# Patient Record
Sex: Male | Born: 1940 | Race: White | Hispanic: No | Marital: Married | Smoking: Never smoker
Health system: Southern US, Community
[De-identification: ages and names within clinical notes are randomized; demographics above are authoritative.]

## PROBLEM LIST (undated history)

## (undated) DIAGNOSIS — E039 Hypothyroidism, unspecified: Secondary | ICD-10-CM

## (undated) DIAGNOSIS — F341 Dysthymic disorder: Secondary | ICD-10-CM

## (undated) DIAGNOSIS — I509 Heart failure, unspecified: Secondary | ICD-10-CM

## (undated) DIAGNOSIS — E119 Type 2 diabetes mellitus without complications: Secondary | ICD-10-CM

## (undated) DIAGNOSIS — E785 Hyperlipidemia, unspecified: Secondary | ICD-10-CM

## (undated) DIAGNOSIS — J449 Chronic obstructive pulmonary disease, unspecified: Secondary | ICD-10-CM

## (undated) DIAGNOSIS — I2510 Atherosclerotic heart disease of native coronary artery without angina pectoris: Secondary | ICD-10-CM

## (undated) HISTORY — DX: Chronic obstructive pulmonary disease, unspecified: J44.9

## (undated) HISTORY — DX: Atherosclerotic heart disease of native coronary artery without angina pectoris: I25.10

## (undated) HISTORY — DX: Dysthymic disorder: F34.1

## (undated) HISTORY — DX: Type 2 diabetes mellitus without complications: E11.9

## (undated) HISTORY — PX: MEDIASTINOSCOPY: SUR861

## (undated) HISTORY — DX: Hyperlipidemia, unspecified: E78.5

## (undated) HISTORY — DX: Hypothyroidism, unspecified: E03.9

## (undated) HISTORY — DX: Heart failure, unspecified: I50.9

## (undated) HISTORY — PX: CORONARY ARTERY BYPASS GRAFT: SHX141

## (undated) HISTORY — PX: CHOLECYSTECTOMY: SHX55

---

## 2009-01-21 ENCOUNTER — Ambulatory Visit: Payer: Self-pay | Admitting: Cardiology

## 2013-07-05 DIAGNOSIS — K219 Gastro-esophageal reflux disease without esophagitis: Secondary | ICD-10-CM | POA: Insufficient documentation

## 2013-07-05 DIAGNOSIS — E559 Vitamin D deficiency, unspecified: Secondary | ICD-10-CM | POA: Insufficient documentation

## 2013-07-05 DIAGNOSIS — I10 Essential (primary) hypertension: Secondary | ICD-10-CM | POA: Insufficient documentation

## 2013-07-05 DIAGNOSIS — I2510 Atherosclerotic heart disease of native coronary artery without angina pectoris: Secondary | ICD-10-CM | POA: Insufficient documentation

## 2013-07-05 DIAGNOSIS — R55 Syncope and collapse: Secondary | ICD-10-CM | POA: Insufficient documentation

## 2013-07-05 DIAGNOSIS — F411 Generalized anxiety disorder: Secondary | ICD-10-CM | POA: Insufficient documentation

## 2013-07-05 DIAGNOSIS — J449 Chronic obstructive pulmonary disease, unspecified: Secondary | ICD-10-CM | POA: Insufficient documentation

## 2014-03-29 DIAGNOSIS — R918 Other nonspecific abnormal finding of lung field: Secondary | ICD-10-CM | POA: Insufficient documentation

## 2016-04-15 LAB — LIPID PANEL
CHOLESTEROL: 175 mg/dL (ref 0–200)
HDL: 45 mg/dL (ref 35–70)
LDL Cholesterol: 107 mg/dL
TRIGLYCERIDES: 137 mg/dL (ref 40–160)

## 2016-04-15 LAB — HEMOGLOBIN A1C: Hemoglobin A1C: 10

## 2016-04-15 LAB — TSH: TSH: 0.72 u[IU]/mL (ref 0.41–5.90)

## 2016-05-09 ENCOUNTER — Encounter: Payer: Self-pay | Admitting: "Endocrinology

## 2016-05-09 ENCOUNTER — Ambulatory Visit (INDEPENDENT_AMBULATORY_CARE_PROVIDER_SITE_OTHER): Payer: Medicare Other | Admitting: "Endocrinology

## 2016-05-09 VITALS — BP 154/68 | HR 71 | Ht 72.0 in | Wt 216.0 lb

## 2016-05-09 DIAGNOSIS — E039 Hypothyroidism, unspecified: Secondary | ICD-10-CM | POA: Diagnosis not present

## 2016-05-09 DIAGNOSIS — I639 Cerebral infarction, unspecified: Secondary | ICD-10-CM | POA: Diagnosis not present

## 2016-05-09 DIAGNOSIS — I10 Essential (primary) hypertension: Secondary | ICD-10-CM | POA: Diagnosis not present

## 2016-05-09 DIAGNOSIS — E7800 Pure hypercholesterolemia, unspecified: Secondary | ICD-10-CM | POA: Diagnosis not present

## 2016-05-09 DIAGNOSIS — C3490 Malignant neoplasm of unspecified part of unspecified bronchus or lung: Secondary | ICD-10-CM | POA: Diagnosis not present

## 2016-05-09 DIAGNOSIS — E1159 Type 2 diabetes mellitus with other circulatory complications: Secondary | ICD-10-CM | POA: Diagnosis not present

## 2016-05-09 NOTE — Progress Notes (Signed)
Subjective:    Patient ID: Barry Turner, male    DOB: 05-02-1940. Patient is being seen in consultation for management of diabetes requested by  Quentin Cornwall, MD  Past Medical History:  Diagnosis Date  . CHF (congestive heart failure) ([place])   . COPD (chronic obstructive pulmonary disease) ([place])   . Coronary artery disease   . Diabetes mellitus, type II ([place])   . Dysthymic disorder   . Hyperlipidemia   . Hypothyroidism    Past Surgical History:  Procedure Laterality Date  . CHOLECYSTECTOMY    . CORONARY ARTERY BYPASS GRAFT    . MEDIASTINOSCOPY     Social History   Social History  . Marital status: Married    Spouse name: N/A  . Number of children: N/A  . Years of education: N/A   Social History Main Topics  . Smoking status: Never Smoker  . Smokeless tobacco: Never Used  . Alcohol use No  . Drug use: No  . Sexual activity: Not Asked   Other Topics Concern  . None   Social History Narrative  . None   Outpatient Encounter Prescriptions as of 05/09/2016  Medication Sig  . amoxicillin (AMOXIL) 500 MG capsule Take 500 mg by mouth as directed.  Marland Kitchen apixaban (ELIQUIS) 5 MG TABS tablet Take 5 mg by mouth 2 (two) times daily.  Marland Kitchen aspirin EC 81 MG tablet Take 81 mg by mouth daily.  . carvedilol (COREG) 12.5 MG tablet Take 12.5 mg by mouth 2 (two) times daily with a meal.  . clarithromycin (BIAXIN) 500 MG tablet Take 500 mg by mouth as directed.  Marland Kitchen esomeprazole (NEXIUM) 40 MG capsule Take 40 mg by mouth daily at 12 noon.  . Fluticasone-Salmeterol (ADVAIR) 250-50 MCG/DOSE AEPB Inhale 1 puff into the lungs 2 (two) times daily as needed.  . furosemide (LASIX) 20 MG tablet Take 20 mg by mouth 2 (two) times daily.  Marland Kitchen ibuprofen (ADVIL,MOTRIN) 800 MG tablet Take 800 mg by mouth every 8 (eight) hours as needed.  . insulin aspart (NOVOLOG FLEXPEN) 100 UNIT/ML FlexPen Inject 10-16 Units into the skin 3 (three) times daily with meals.  . Insulin Glargine (TOUJEO SOLOSTAR) 300  UNIT/ML SOPN Inject 40 Units into the skin at bedtime.  Marland Kitchen ipratropium-albuterol (DUONEB) 0.5-2.5 (3) MG/3ML SOLN Take 3 mLs by nebulization as directed.  . isosorbide dinitrate (ISORDIL) 30 MG tablet Take 30 mg by mouth daily.  Marland Kitchen levothyroxine (SYNTHROID, LEVOTHROID) 50 MCG tablet Take 50 mcg by mouth daily before breakfast.  . LORazepam (ATIVAN) 0.5 MG tablet Take 0.5 mg by mouth at bedtime.  Marland Kitchen losartan (COZAAR) 100 MG tablet Take 100 mg by mouth daily.  . meclizine (ANTIVERT) 25 MG tablet Take 25 mg by mouth 2 (two) times daily as needed for dizziness.  . nitroGLYCERIN (NITROSTAT) 0.4 MG SL tablet Place 0.4 mg under the tongue every 5 (five) minutes as needed for chest pain.  . potassium chloride (K-DUR) 10 MEQ tablet Take 10 mEq by mouth daily.  . predniSONE (DELTASONE) 10 MG tablet Take 10 mg by mouth daily with breakfast.  . ranitidine (ZANTAC) 150 MG capsule Take 150 mg by mouth 2 (two) times daily.  . TERBINAFINE HCL PO Take by mouth.   No facility-administered encounter medications on file as of 05/09/2016.    ALLERGIES: Allergies  Allergen Reactions  . Plavix [Clopidogrel]   . Sucralfate    VACCINATION STATUS:  There is no immunization history on file for this patient.  Diabetes  He presents for his initial diabetic visit. He has type 2 diabetes mellitus. Onset time: He was diagnosed at approximate age of 76 years. His disease course has been worsening. There are no hypoglycemic associated symptoms. Pertinent negatives for hypoglycemia include no confusion, headaches, pallor or seizures. There are no diabetic associated symptoms. Pertinent negatives for diabetes include no chest pain, no fatigue, no polydipsia, no polyphagia, no polyuria and no weakness. There are no hypoglycemic complications. Symptoms are worsening. Diabetic complications include a CVA and peripheral neuropathy. Risk factors for coronary artery disease include diabetes mellitus, hypertension, obesity and sedentary  lifestyle. Current diabetic treatments: He is currently on basal /bolus insulin he reportedlyToujeo 42 units daily at bedtime and NovoLog 20 units 3 times a day before meals. His weight is decreasing steadily. He is following a generally unhealthy diet. When asked about meal planning, he reported none. He has not had a previous visit with a dietitian. He participates in exercise intermittently. His breakfast blood glucose range is generally >200 mg/dl. His lunch blood glucose range is generally 140-180 mg/dl. His dinner blood glucose range is generally 140-180 mg/dl. His overall blood glucose range is >200 mg/dl. An ACE inhibitor/angiotensin II receptor blocker is being taken.  Hyperlipidemia  This is a chronic problem. The current episode started more than 1 year ago. The problem is uncontrolled. Exacerbating diseases include diabetes and hypothyroidism. Pertinent negatives include no chest pain, myalgias or shortness of breath. He is currently on no antihyperlipidemic treatment. Risk factors for coronary artery disease include diabetes mellitus, dyslipidemia, hypertension, a sedentary lifestyle and male sex.  Hypertension  This is a chronic problem. The current episode started more than 1 year ago. Pertinent negatives include no chest pain, headaches, neck pain, palpitations or shortness of breath. Risk factors for coronary artery disease include diabetes mellitus, dyslipidemia and male gender. Past treatments include angiotensin blockers. Hypertensive end-organ damage includes CVA.       Review of Systems  Constitutional: Negative for chills, fatigue, fever and unexpected weight change.  HENT: Negative for dental problem, mouth sores and trouble swallowing.   Eyes: Negative for visual disturbance.  Respiratory: Negative for cough, choking, chest tightness, shortness of breath and wheezing.   Cardiovascular: Negative for chest pain, palpitations and leg swelling.  Gastrointestinal: Negative for  abdominal distention, abdominal pain, constipation, diarrhea, nausea and vomiting.  Endocrine: Negative for polydipsia, polyphagia and polyuria.  Genitourinary: Negative for dysuria, flank pain, hematuria and urgency.  Musculoskeletal: Negative for back pain, gait problem, myalgias and neck pain.  Skin: Negative for pallor, rash and wound.  Neurological: Negative for seizures, syncope, weakness, numbness and headaches.  Psychiatric/Behavioral: Negative.  Negative for confusion and dysphoric mood.    Objective:    BP (!) 154/68   Pulse 71   Ht 6' (1.829 m)   Wt 216 lb (98 kg)   BMI 29.29 kg/m   Wt Readings from Last 3 Encounters:  05/09/16 216 lb (98 kg)    Physical Exam  Constitutional: He is oriented to person, place, and time. He appears well-developed and well-nourished. He is cooperative. No distress.  HENT:  Head: Normocephalic and atraumatic.  Eyes: EOM are normal.  Neck: Normal range of motion. Neck supple. No tracheal deviation present. No thyromegaly present.  Cardiovascular: Normal rate, S1 normal, S2 normal and normal heart sounds.  Exam reveals no gallop.   No murmur heard. Pulses:      Dorsalis pedis pulses are 1+ on the right side, and 1+ on the  left side.       Posterior tibial pulses are 1+ on the right side, and 1+ on the left side.  Pulmonary/Chest: No respiratory distress. He has wheezes. He has rales.  Abdominal: Soft. Bowel sounds are normal. He exhibits no distension. There is no tenderness. There is no guarding and no CVA tenderness.  Musculoskeletal: He exhibits no edema.       Right shoulder: He exhibits no swelling and no deformity.  Neurological: He is alert and oriented to person, place, and time. He has normal strength and normal reflexes. No cranial nerve deficit or sensory deficit. Gait normal.  Skin: Skin is warm and dry. No rash noted. No cyanosis. Nails show no clubbing.  Psychiatric: He has a normal mood and affect. His speech is normal and  behavior is normal. Judgment and thought content normal. Cognition and memory are normal.    Diabetic Labs (most recent): Lab Results  Component Value Date   HGBA1C 10 04/15/2016     Lipid Panel ( most recent) Lipid Panel     Component Value Date/Time   CHOL 175 04/15/2016   TRIG 137 04/15/2016   HDL 45 04/15/2016   LDLCALC 107 04/15/2016    Assessment & Plan:   1. DM type 2 causing vascular disease ([place])  - Patient has currently uncontrolled symptomatic type 2 DM since 76 years of age,  with most recent A1c of 10 %. Recent labs reviewed. - His recent and logs shown significant fluctuation in his blood glucose profile, including postprandial hypoglycemia into 60s randomly.  his diabetes is complicated by CVA and patient remains at a high risk for more acute and chronic complications of diabetes which include CAD, CVA, CKD, retinopathy, and neuropathy. These are all discussed in detail with the patient.  - I have counseled the patient on diet management and weight loss, by adopting a carbohydrate restricted/protein rich diet.  - Suggestion is made for patient to avoid simple carbohydrates   from their diet including Cakes , Desserts, Ice Cream,  Soda (  diet and regular) , Sweet Tea , Candies,  Chips, Cookies, Artificial Sweeteners,   and "Sugar-free" Products . This will help patient to have stable blood glucose profile and potentially avoid unintended weight gain.  - I encouraged the patient to switch to  unprocessed or minimally processed complex starch and increased protein intake (animal or plant source), fruits, and vegetables.  - Patient is advised to stick to a routine mealtimes to eat 3 meals  a day and avoid unnecessary snacks ( to snack only to correct hypoglycemia).  - The patient will be scheduled with Jearld Fenton, RDN, CDE for individualized DM education.  - I have approached patient with the following individualized plan to manage diabetes and patient agrees:    -  He is taking prednisone 10 mg by mouth every a.m. for chronic COPD. I advised him to avoid sudden stopping of prednisone to prevent adrenal crisis.  - He will need significant adjustment in his basal/bolus insulin. The main goal of therapy in this patient would be avoiding hypoglycemia due to advanced age. -  I  will proceed to readjust his basal insulin Toujeo to 40 units daily at bedtime, lower his prandial insulin NovoLog to 10 units 3 times a day before meals for premeal BG readings of 90-'150mg'$ /dl, plus patient specific correction dose for unexpected hyperglycemia above '150mg'$ /dl, associated with strict monitoring of glucose  AC and HS. - Patient is warned not to take insulin  without proper monitoring per orders. -Adjustment parameters are given for hypo and hyperglycemia in writing. -Patient is encouraged to call clinic for blood glucose levels less than 70 or above 300 mg /dl. - Reportedly, he does not tolerate metformin, tried DPPIV  inhibitors before, and like to avoid those medications for now. - His renal function is normal. - Patient will be considered for incretin therapy as appropriate next visit. - Patient specific target  A1c;  LDL, HDL, Triglycerides, and  Waist Circumference were discussed in detail.  2) BP/HTN: Uncontrolled. Continue current medications including ARB. 3) Lipids/HPL:   Uncontrolled with LDL 107, he is not on statin. He will be considered for low-dose statin on subsequent visit.  4)  Weight/Diet: CDE Consult will be initiated , exercise, and detailed carbohydrates information provided.  5) hypothyroidism: -TSH from 04/15/2016 was 0.72. He is on levothyroxine 50 g by mouth every morning.  - We discussed about correct intake of levothyroxine, at fasting, with water, separated by at least 30 minutes from breakfast, and separated by more than 4 hours from calcium, iron, multivitamins, acid reflux medications (PPIs). -Patient is made aware of the fact that  thyroid hormone replacement is needed for life, dose to be adjusted by periodic monitoring of thyroid function tests.  6) Chronic Care/Health Maintenance:  -Patient is on ARB and encouraged to continue to follow up with Ophthalmology, Podiatrist at least yearly or according to recommendations, and advised to   stay away from smoking. I have recommended yearly flu vaccine and pneumonia vaccination at least every 5 years; moderate intensity exercise for up to 150 minutes weekly; and  sleep for at least 7 hours a day.  - 60 minutes of time was spent on the care of this patient , 50% of which was applied for counseling on diabetes complications and their preventions.  - Patient to bring meter and  blood glucose logs during his next visit.   - I advised patient to maintain close follow up with [facility], MD for primary care needs.  Follow up plan: - Return in about 1 week (around 05/16/2016) for follow up with meter and logs- no labs.  Glade Lloyd, MD Phone: [phone]  Fax: [phone]   05/09/2016, 4:12 PM

## 2016-05-09 NOTE — Patient Instructions (Signed)

## 2016-05-13 ENCOUNTER — Other Ambulatory Visit: Payer: Self-pay

## 2016-05-13 MED ORDER — INSULIN GLARGINE 300 UNIT/ML ~~LOC~~ SOPN: 40.0000 [IU] | PEN_INJECTOR | Freq: Every day | SUBCUTANEOUS | 2 refills | Status: DC

## 2016-05-21 ENCOUNTER — Encounter: Payer: Self-pay | Admitting: "Endocrinology

## 2016-05-21 ENCOUNTER — Ambulatory Visit (INDEPENDENT_AMBULATORY_CARE_PROVIDER_SITE_OTHER): Payer: Medicare Other | Admitting: "Endocrinology

## 2016-05-21 VITALS — BP 154/67 | HR 69 | Ht 72.0 in | Wt 214.0 lb

## 2016-05-21 DIAGNOSIS — I10 Essential (primary) hypertension: Secondary | ICD-10-CM | POA: Diagnosis not present

## 2016-05-21 DIAGNOSIS — E7800 Pure hypercholesterolemia, unspecified: Secondary | ICD-10-CM

## 2016-05-21 DIAGNOSIS — E1159 Type 2 diabetes mellitus with other circulatory complications: Secondary | ICD-10-CM

## 2016-05-21 DIAGNOSIS — E039 Hypothyroidism, unspecified: Secondary | ICD-10-CM | POA: Diagnosis not present

## 2016-05-21 NOTE — Progress Notes (Signed)
Subjective:    Patient ID: Barry Turner, male    DOB: 1940-09-03. Patient is being seen in f/u for management of diabetes requested by  Quentin Cornwall, MD  Past Medical History:  Diagnosis Date  . CHF (congestive heart failure) ([place])   . COPD (chronic obstructive pulmonary disease) ([place])   . Coronary artery disease   . Diabetes mellitus, type II ([place])   . Dysthymic disorder   . Hyperlipidemia   . Hypothyroidism    Past Surgical History:  Procedure Laterality Date  . CHOLECYSTECTOMY    . CORONARY ARTERY BYPASS GRAFT    . MEDIASTINOSCOPY     Social History   Social History  . Marital status: Married    Spouse name: N/A  . Number of children: N/A  . Years of education: N/A   Social History Main Topics  . Smoking status: Never Smoker  . Smokeless tobacco: Never Used  . Alcohol use No  . Drug use: No  . Sexual activity: Not Asked   Other Topics Concern  . None   Social History Narrative  . None   Outpatient Encounter Prescriptions as of 05/21/2016  Medication Sig  . amoxicillin (AMOXIL) 500 MG capsule Take 500 mg by mouth as directed.  Marland Kitchen apixaban (ELIQUIS) 5 MG TABS tablet Take 5 mg by mouth 2 (two) times daily.  Marland Kitchen aspirin EC 81 MG tablet Take 81 mg by mouth daily.  . carvedilol (COREG) 12.5 MG tablet Take 12.5 mg by mouth 2 (two) times daily with a meal.  . clarithromycin (BIAXIN) 500 MG tablet Take 500 mg by mouth as directed.  Marland Kitchen esomeprazole (NEXIUM) 40 MG capsule Take 40 mg by mouth daily at 12 noon.  . Fluticasone-Salmeterol (ADVAIR) 250-50 MCG/DOSE AEPB Inhale 1 puff into the lungs 2 (two) times daily as needed.  . furosemide (LASIX) 20 MG tablet Take 20 mg by mouth 2 (two) times daily.  Marland Kitchen ibuprofen (ADVIL,MOTRIN) 800 MG tablet Take 800 mg by mouth every 8 (eight) hours as needed.  . insulin aspart (NOVOLOG FLEXPEN) 100 UNIT/ML FlexPen Inject 10-16 Units into the skin 3 (three) times daily with meals.  . Insulin Glargine (TOUJEO SOLOSTAR) 300 UNIT/ML  SOPN Inject 40 Units into the skin at bedtime.  Marland Kitchen ipratropium-albuterol (DUONEB) 0.5-2.5 (3) MG/3ML SOLN Take 3 mLs by nebulization as directed.  . isosorbide dinitrate (ISORDIL) 30 MG tablet Take 30 mg by mouth daily.  Marland Kitchen levothyroxine (SYNTHROID, LEVOTHROID) 50 MCG tablet Take 50 mcg by mouth daily before breakfast.  . LORazepam (ATIVAN) 0.5 MG tablet Take 0.5 mg by mouth at bedtime.  Marland Kitchen losartan (COZAAR) 100 MG tablet Take 100 mg by mouth daily.  . meclizine (ANTIVERT) 25 MG tablet Take 25 mg by mouth 2 (two) times daily as needed for dizziness.  . nitroGLYCERIN (NITROSTAT) 0.4 MG SL tablet Place 0.4 mg under the tongue every 5 (five) minutes as needed for chest pain.  . potassium chloride (K-DUR) 10 MEQ tablet Take 10 mEq by mouth daily.  . predniSONE (DELTASONE) 10 MG tablet Take 10 mg by mouth daily with breakfast.  . ranitidine (ZANTAC) 150 MG capsule Take 150 mg by mouth 2 (two) times daily.  . TERBINAFINE HCL PO Take by mouth.   No facility-administered encounter medications on file as of 05/21/2016.    ALLERGIES: Allergies  Allergen Reactions  . Plavix [Clopidogrel]   . Sucralfate    VACCINATION STATUS:  There is no immunization history on file for this patient.  Diabetes  He presents for his follow-up diabetic visit. He has type 2 diabetes mellitus. Onset time: He was diagnosed at approximate age of 67 years. His disease course has been improving. There are no hypoglycemic associated symptoms. Pertinent negatives for hypoglycemia include no confusion, headaches, pallor or seizures. There are no diabetic associated symptoms. Pertinent negatives for diabetes include no chest pain, no fatigue, no polydipsia, no polyphagia, no polyuria and no weakness. There are no hypoglycemic complications. Symptoms are improving. Diabetic complications include a CVA and peripheral neuropathy. Risk factors for coronary artery disease include diabetes mellitus, hypertension, obesity and sedentary  lifestyle. His weight is decreasing steadily. He is following a generally unhealthy diet. When asked about meal planning, he reported none. He has not had a previous visit with a dietitian. He participates in exercise intermittently. His breakfast blood glucose range is generally 180-200 mg/dl. His lunch blood glucose range is generally 180-200 mg/dl. His dinner blood glucose range is generally 180-200 mg/dl. His overall blood glucose range is 180-200 mg/dl. An ACE inhibitor/angiotensin II receptor blocker is being taken.  Hyperlipidemia  This is a chronic problem. The current episode started more than 1 year ago. The problem is uncontrolled. Exacerbating diseases include diabetes and hypothyroidism. Pertinent negatives include no chest pain, myalgias or shortness of breath. He is currently on no antihyperlipidemic treatment. Risk factors for coronary artery disease include diabetes mellitus, dyslipidemia, hypertension, a sedentary lifestyle and male sex.  Hypertension  This is a chronic problem. The current episode started more than 1 year ago. Pertinent negatives include no chest pain, headaches, neck pain, palpitations or shortness of breath. Risk factors for coronary artery disease include diabetes mellitus, dyslipidemia and male gender. Past treatments include angiotensin blockers. Hypertensive end-organ damage includes CVA.       Review of Systems  Constitutional: Negative for chills, fatigue, fever and unexpected weight change.  HENT: Negative for dental problem, mouth sores and trouble swallowing.   Eyes: Negative for visual disturbance.  Respiratory: Negative for cough, choking, chest tightness, shortness of breath and wheezing.   Cardiovascular: Negative for chest pain, palpitations and leg swelling.  Gastrointestinal: Negative for abdominal distention, abdominal pain, constipation, diarrhea, nausea and vomiting.  Endocrine: Negative for polydipsia, polyphagia and polyuria.   Genitourinary: Negative for dysuria, flank pain, hematuria and urgency.  Musculoskeletal: Negative for back pain, gait problem, myalgias and neck pain.  Skin: Negative for pallor, rash and wound.  Neurological: Negative for seizures, syncope, weakness, numbness and headaches.  Psychiatric/Behavioral: Negative.  Negative for confusion and dysphoric mood.    Objective:    BP (!) 154/67   Pulse 69   Ht 6' (1.829 m)   Wt 214 lb (97.1 kg)   BMI 29.02 kg/m   Wt Readings from Last 3 Encounters:  05/21/16 214 lb (97.1 kg)  05/09/16 216 lb (98 kg)    Physical Exam  Constitutional: He is oriented to person, place, and time. He appears well-developed and well-nourished. He is cooperative. No distress.  HENT:  Head: Normocephalic and atraumatic.  Eyes: EOM are normal.  Neck: Normal range of motion. Neck supple. No tracheal deviation present. No thyromegaly present.  Cardiovascular: Normal rate, S1 normal, S2 normal and normal heart sounds.  Exam reveals no gallop.   No murmur heard. Pulses:      Dorsalis pedis pulses are 1+ on the right side, and 1+ on the left side.       Posterior tibial pulses are 1+ on the right side, and 1+ on the  left side.  Pulmonary/Chest: No respiratory distress. He has wheezes. He has rales.  Abdominal: Soft. Bowel sounds are normal. He exhibits no distension. There is no tenderness. There is no guarding and no CVA tenderness.  Musculoskeletal: He exhibits no edema.       Right shoulder: He exhibits no swelling and no deformity.  Neurological: He is alert and oriented to person, place, and time. He has normal strength and normal reflexes. No cranial nerve deficit or sensory deficit. Gait normal.  Skin: Skin is warm and dry. No rash noted. No cyanosis. Nails show no clubbing.  Psychiatric: He has a normal mood and affect. His speech is normal and behavior is normal. Judgment and thought content normal. Cognition and memory are normal.    Diabetic Labs (most  recent): Lab Results  Component Value Date   HGBA1C 10 04/15/2016     Lipid Panel ( most recent) Lipid Panel     Component Value Date/Time   CHOL 175 04/15/2016   TRIG 137 04/15/2016   HDL 45 04/15/2016   LDLCALC 107 04/15/2016    Assessment & Plan:   1. DM type 2 causing vascular disease ([place])  - Patient has currently uncontrolled symptomatic type 2 DM since 76 years of age,  with most recent A1c of 10 %. Recent labs reviewed. -  He came with significant improvement in his blood glucose profile, no major hypoglycemia nor  Persistent hyperglycemia. Average of 47 readings in the last 14 days is 189.   his diabetes is complicated by CVA and patient remains at a high risk for more acute and chronic complications of diabetes which include CAD, CVA, CKD, retinopathy, and neuropathy. These are all discussed in detail with the patient.  - I have counseled the patient on diet management and weight loss, by adopting a carbohydrate restricted/protein rich diet.  - Suggestion is made for patient to avoid simple carbohydrates   from their diet including Cakes , Desserts, Ice Cream,  Soda (  diet and regular) , Sweet Tea , Candies,  Chips, Cookies, Artificial Sweeteners,   and "Sugar-free" Products . This will help patient to have stable blood glucose profile and potentially avoid unintended weight gain.  - I encouraged the patient to switch to  unprocessed or minimally processed complex starch and increased protein intake (animal or plant source), fruits, and vegetables.  - Patient is advised to stick to a routine mealtimes to eat 3 meals  a day and avoid unnecessary snacks ( to snack only to correct hypoglycemia).  - The patient will be scheduled with Jearld Fenton, RDN, CDE for individualized DM education.  - I have approached patient with the following individualized plan to manage diabetes and patient agrees:   -  He is taking prednisone 10 mg by mouth every a.m. for chronic COPD. I  advised him to avoid sudden stopping of prednisone to prevent adrenal crisis.  - He will need significant adjustment in his basal/bolus insulin. The main goal of therapy in this patient would be avoiding hypoglycemia due to advanced age. -  I  will proceed with basal insulin Toujeo  40 units daily at bedtime, continue NovoLog 10 units 3 times a day before meals for premeal BG readings of 90-'150mg'$ /dl, plus patient specific correction dose for unexpected hyperglycemia above '150mg'$ /dl, associated with strict monitoring of glucose  AC and HS. - Patient is warned not to take insulin without proper monitoring per orders. -Adjustment parameters are given for hypo and hyperglycemia in writing. -  Patient is encouraged to call clinic for blood glucose levels less than 70 or above 300 mg /dl. - Reportedly, he does not tolerate metformin, tried DPPIV  inhibitors before, and like to avoid those medications for now. - His renal function is normal. - Patient will be considered for incretin therapy as appropriate next visit. - Patient specific target  A1c;  LDL, HDL, Triglycerides, and  Waist Circumference were discussed in detail.  2) BP/HTN: Uncontrolled. Continue current medications including ARB. 3) Lipids/HPL:   Uncontrolled with LDL 107, he is not on statin. He will be considered for low-dose statin on subsequent visit.  4)  Weight/Diet: CDE Consult will be initiated , exercise, and detailed carbohydrates information provided.  5) hypothyroidism: -TSH from 04/15/2016 was 0.72. He is on levothyroxine 50 g by mouth every morning.  - We discussed about correct intake of levothyroxine, at fasting, with water, separated by at least 30 minutes from breakfast, and separated by more than 4 hours from calcium, iron, multivitamins, acid reflux medications (PPIs). -Patient is made aware of the fact that thyroid hormone replacement is needed for life, dose to be adjusted by periodic monitoring of thyroid function  tests.  6) Chronic Care/Health Maintenance:  -Patient is on ARB and encouraged to continue to follow up with Ophthalmology, Podiatrist at least yearly or according to recommendations, and advised to   stay away from smoking. I have recommended yearly flu vaccine and pneumonia vaccination at least every 5 years; moderate intensity exercise for up to 150 minutes weekly; and  sleep for at least 7 hours a day.  - 30 minutes of time was spent on the care of this patient , 50% of which was applied for counseling on diabetes complications and their preventions.  - Patient to bring meter and  blood glucose logs during his next visit.   - I advised patient to maintain close follow up with [facility], MD for primary care needs.  Follow up plan: - Return in about 10 weeks (around 07/30/2016) for follow up with pre-visit labs, meter, and logs.  Glade Lloyd, MD Phone: [phone]  Fax: [phone]   05/21/2016, 1:46 PM

## 2016-05-21 NOTE — Patient Instructions (Signed)

## 2016-05-23 ENCOUNTER — Telehealth: Payer: Self-pay | Admitting: "Endocrinology

## 2016-05-23 ENCOUNTER — Other Ambulatory Visit: Payer: Self-pay | Admitting: "Endocrinology

## 2016-05-23 MED ORDER — INSULIN DEGLUDEC 100 UNIT/ML ~~LOC~~ SOPN: 40.0000 [IU] | PEN_INJECTOR | Freq: Every day | SUBCUTANEOUS | 2 refills | Status: DC

## 2016-05-23 NOTE — Telephone Encounter (Signed)
We sent his med to the wrong pharmacy Can we please sent to [facility] in [place] fax number is [phone]

## 2016-06-06 ENCOUNTER — Ambulatory Visit: Payer: Self-pay | Admitting: Nutrition

## 2016-06-10 ENCOUNTER — Encounter: Payer: Medicare Other | Attending: "Endocrinology | Admitting: Nutrition

## 2016-06-10 VITALS — Ht 73.0 in | Wt 214.0 lb

## 2016-06-10 DIAGNOSIS — Z713 Dietary counseling and surveillance: Secondary | ICD-10-CM | POA: Diagnosis present

## 2016-06-10 DIAGNOSIS — Z794 Long term (current) use of insulin: Secondary | ICD-10-CM

## 2016-06-10 DIAGNOSIS — E1159 Type 2 diabetes mellitus with other circulatory complications: Secondary | ICD-10-CM | POA: Insufficient documentation

## 2016-06-10 DIAGNOSIS — E1165 Type 2 diabetes mellitus with hyperglycemia: Secondary | ICD-10-CM

## 2016-06-10 DIAGNOSIS — IMO0002 Reserved for concepts with insufficient information to code with codable children: Secondary | ICD-10-CM

## 2016-06-10 DIAGNOSIS — E669 Obesity, unspecified: Secondary | ICD-10-CM

## 2016-06-10 DIAGNOSIS — E118 Type 2 diabetes mellitus with unspecified complications: Secondary | ICD-10-CM

## 2016-06-10 NOTE — Patient Instructions (Signed)
Goals 1. Follow My Plate-3 balanced meals per day. 2. Increase vegetables with lunch and dinner 3. Eat 45 grams of carb per meal plus protein and veggies. 4. Keep drinking water 5. Avoid snacks between meals unless veggies or nuts or protein. 6. Take  40 units of Tresiba/Toujeo at night and follow Novolog of 10 units plus sliding scale of with meals. Keep up the great job!!!

## 2016-06-10 NOTE — Progress Notes (Signed)
Diabetes Self-Management Education  Visit Type: First/Initial  Appt. Start Time: 1430 Appt. End Time: 0539  06/10/2016  [name], identified by name and date of birth, is a 76 y.o. male with a diagnosis of Diabetes: Type 2. Sees Dr. Dorris Fetch for Endocrinology. Has issues with reflux, losing his balance at times. Testing 4 times per day. Tresiba 40 units daily and Novolog 10 units TID plus sliding scale. Compliant with meds and diet.  ASSESSMENT  Height '6\' 1"'$  (1.854 m), weight 214 lb (97.1 kg). Body mass index is 28.23 kg/m.      Diabetes Self-Management Education - 06/10/16 1427      Visit Information   Visit Type First/Initial     Initial Visit   Diabetes Type Type 2   Are you taking your medications as prescribed? Yes   Date Diagnosed [place]   How would you rate your overall health? Good     Psychosocial Assessment   Patient Belief/Attitude about Diabetes Motivated to manage diabetes   Self-care barriers None   Self-management support Family;Doctor's office   Other persons present Patient   Patient Concerns Nutrition/Meal planning;Medication;Monitoring;Healthy Lifestyle;Problem Solving;Glycemic Control;Weight Control;Support   Special Needs None   Preferred Learning Style No preference indicated   Learning Readiness Not Ready   How often do you need to have someone help you when you read instructions, pamphlets, or other written materials from your doctor or pharmacy? 1 - Never   What is the last grade level you completed in school? 12     Pre-Education Assessment   Patient understands the diabetes disease and treatment process. Needs Instruction   Patient understands incorporating nutritional management into lifestyle. Needs Instruction   Patient undertands incorporating physical activity into lifestyle. Needs Instruction   Patient understands using medications safely. Needs Instruction   Patient understands monitoring blood glucose, interpreting  and using results Needs Instruction   Patient understands prevention, detection, and treatment of acute complications. Needs Instruction   Patient understands prevention, detection, and treatment of chronic complications. Needs Instruction   Patient understands how to develop strategies to address psychosocial issues. Needs Instruction   Patient understands how to develop strategies to promote health/change behavior. Needs Instruction     Complications   Last HgB A1C per patient/outside source 10 %   How often do you check your blood sugar? 1-2 times/day   Fasting Blood glucose range (mg/dL) >200   Postprandial Blood glucose range (mg/dL) 180-200;130-179   Number of hypoglycemic episodes per month 0   Number of hyperglycemic episodes per week 10   Can you tell when your blood sugar is high? Yes   Have you had a dilated eye exam in the past 12 months? Yes   Have you had a dental exam in the past 12 months? Yes   Are you checking your feet? Yes   How many days per week are you checking your feet? 7     Dietary Intake   Breakfast 2 eggs and 2 applesauce 4oz or cheerios and milk and 1 applesauce   Snack (morning) applesauce   Lunch eggs and applesauces 2-4oz   Dinner tacos 2, soft without sourcream taco bell, Twist orange   Snack (evening) fig bar, water or sprite   Beverage(s) water, sprite,       Exercise   Exercise Type ADL's     Patient Education   Previous Diabetes Education Yes (please comment)  2008   Disease state  Definition of diabetes, type 1 and 2, and the diagnosis of diabetes   Nutrition management  Role of diet in the treatment of diabetes and the relationship between the three main macronutrients and blood glucose level;Food label reading, portion sizes and measuring food.;Carbohydrate counting;Meal timing in regards to the patients' current diabetes medication.;Reviewed blood glucose goals for pre and post meals and how to evaluate the patients' food intake on their  blood glucose level.;Information on hints to eating out and maintain blood glucose control.;Meal options for control of blood glucose level and chronic complications.   Physical activity and exercise  Role of exercise on diabetes management, blood pressure control and cardiac health.   Medications Taught/reviewed insulin injection, site rotation, insulin storage and needle disposal.;Reviewed patients medication for diabetes, action, purpose, timing of dose and side effects.;Reviewed medication adjustment guidelines for hyperglycemia and sick days.   Monitoring Taught/evaluated SMBG meter.;Purpose and frequency of SMBG.;Taught/discussed recording of test results and interpretation of SMBG.;Interpreting lab values - A1C, lipid, urine microalbumina.;Identified appropriate SMBG and/or A1C goals.   Acute complications Taught treatment of hypoglycemia - the 15 rule.;Discussed and identified patients' treatment of hyperglycemia.;Covered sick day management with medication and food.   Chronic complications Relationship between chronic complications and blood glucose control;Assessed and discussed foot care and prevention of foot problems;Lipid levels, blood glucose control and heart disease;Retinopathy and reason for yearly dilated eye exams;Nephropathy, what it is, prevention of, the use of ACE, ARB's and early detection of through urine microalbumia.;Reviewed with patient heart disease, higher risk of, and prevention;Identified and discussed with patient  current chronic complications   Psychosocial adjustment Worked with patient to identify barriers to care and solutions;Role of stress on diabetes   Personal strategies to promote health Lifestyle issues that need to be addressed for better diabetes care     Individualized Goals (developed by patient)   Nutrition General guidelines for healthy choices and portions discussed;Adjust meds/carbs with exercise as discussed   Medications take my medication as  prescribed   Monitoring  test my blood glucose as discussed;send in my blood glucose log as discussed   Reducing Risk examine blood glucose patterns;get labs drawn;do foot checks daily     Post-Education Assessment   Patient understands the diabetes disease and treatment process. Needs Review   Patient understands incorporating nutritional management into lifestyle. Needs Review   Patient undertands incorporating physical activity into lifestyle. Needs Review   Patient understands using medications safely. Needs Review   Patient understands monitoring blood glucose, interpreting and using results Needs Review   Patient understands prevention, detection, and treatment of acute complications. Needs Review   Patient understands prevention, detection, and treatment of chronic complications. Needs Review   Patient understands how to develop strategies to address psychosocial issues. Needs Review   Patient understands how to develop strategies to promote health/change behavior. Needs Review     Outcomes   Expected Outcomes Demonstrated interest in learning. Expect positive outcomes   Future DMSE 4-6 wks   Program Status Completed      Individualized Plan for Diabetes Self-Management Training:   Learning Objective:  Patient will have a greater understanding of diabetes self-management. Patient education plan is to attend individual and/or group sessions per assessed needs and concerns.   Plan:   Goals 1. Follow My Plate-3 balanced meals per day. 2. Increase vegetables with lunch and dinner 3. Eat 45 grams of carb per meal plus protein and veggies. 4. Keep drinking water 5. Avoid snacks between meals unless veggies or  nuts or protein. 6. Take  40 units of Tresiba/Toujeo at night and follow Novolog of 10 units plus sliding scale of with meals. Keep up the great job!!! Expected Outcomes:  Demonstrated interest in learning. Expect positive outcomes  Education material provided: Living  Well with Diabetes, Food label handouts, A1C conversion sheet, Meal plan card, My Plate and Carbohydrate counting sheet  If problems or questions, patient to contact team via:  Phone and Email  Future DSME appointment: 4-6 wks

## 2016-07-22 ENCOUNTER — Encounter: Payer: Self-pay | Admitting: "Endocrinology

## 2016-07-22 LAB — HEMOGLOBIN A1C: Hemoglobin A1C: 9.4

## 2016-07-29 ENCOUNTER — Encounter: Payer: Medicare Other | Attending: Family Medicine | Admitting: Nutrition

## 2016-07-29 ENCOUNTER — Encounter: Payer: Self-pay | Admitting: "Endocrinology

## 2016-07-29 ENCOUNTER — Ambulatory Visit (INDEPENDENT_AMBULATORY_CARE_PROVIDER_SITE_OTHER): Payer: Medicare Other | Admitting: "Endocrinology

## 2016-07-29 ENCOUNTER — Ambulatory Visit: Payer: Medicare Other | Admitting: "Endocrinology

## 2016-07-29 VITALS — Wt 219.0 lb

## 2016-07-29 VITALS — BP 133/68 | HR 63 | Ht 72.0 in | Wt 219.0 lb

## 2016-07-29 DIAGNOSIS — E1110 Type 2 diabetes mellitus with ketoacidosis without coma: Secondary | ICD-10-CM

## 2016-07-29 DIAGNOSIS — E118 Type 2 diabetes mellitus with unspecified complications: Secondary | ICD-10-CM

## 2016-07-29 DIAGNOSIS — E039 Hypothyroidism, unspecified: Secondary | ICD-10-CM | POA: Diagnosis not present

## 2016-07-29 DIAGNOSIS — E7800 Pure hypercholesterolemia, unspecified: Secondary | ICD-10-CM | POA: Diagnosis not present

## 2016-07-29 DIAGNOSIS — E1159 Type 2 diabetes mellitus with other circulatory complications: Secondary | ICD-10-CM | POA: Insufficient documentation

## 2016-07-29 DIAGNOSIS — IMO0002 Reserved for concepts with insufficient information to code with codable children: Secondary | ICD-10-CM

## 2016-07-29 DIAGNOSIS — E1165 Type 2 diabetes mellitus with hyperglycemia: Secondary | ICD-10-CM

## 2016-07-29 DIAGNOSIS — I10 Essential (primary) hypertension: Secondary | ICD-10-CM | POA: Diagnosis not present

## 2016-07-29 DIAGNOSIS — Z713 Dietary counseling and surveillance: Secondary | ICD-10-CM | POA: Diagnosis not present

## 2016-07-29 DIAGNOSIS — Z794 Long term (current) use of insulin: Secondary | ICD-10-CM

## 2016-07-29 LAB — GLUCOSE, POCT (MANUAL RESULT ENTRY): POC Glucose: 183 mg/dl — AB (ref 70–99)

## 2016-07-29 MED ORDER — INSULIN DEGLUDEC 100 UNIT/ML ~~LOC~~ SOPN: 44.0000 [IU] | PEN_INJECTOR | Freq: Every day | SUBCUTANEOUS | 2 refills | Status: DC

## 2016-07-29 NOTE — Progress Notes (Signed)
Diabetes Self-Management Education  Visit Type:  Follow-up  Appt. Start Time: 1400 Appt. End Time: 2703  07/29/2016  [name], identified by name and date of birth, is a 76 y.o. male with a diagnosis of Diabetes: Type 2.  He notes his BS was 88 this am before lunch and he ate a fig bar and drank a soda before coming. He notes his stomach gets upset. Has been in the hospital with breathing problems and his blood sugars are elevated due to medications.  ASSESSMENT  Weight 219 lb (99.3 kg). Body mass index is 28.89 kg/m.       Diabetes Self-Management Education - 07/29/16 1352      Health Coping   How would you rate your overall health? Good     Psychosocial Assessment   Self-care barriers Hard of hearing;Debilitated state due to current medical condition   Self-management support Doctor's office;Family   Patient Concerns Healthy Lifestyle;Nutrition/Meal planning;Medication   Special Needs None   Preferred Learning Style No preference indicated   Learning Readiness Ready     Pre-Education Assessment   Patient understands the diabetes disease and treatment process. Needs Review   Patient understands incorporating nutritional management into lifestyle. Needs Review   Patient undertands incorporating physical activity into lifestyle. Needs Review   Patient understands using medications safely. Needs Review   Patient understands monitoring blood glucose, interpreting and using results Needs Review   Patient understands prevention, detection, and treatment of acute complications. Demonstrates understanding / competency   Patient understands prevention, detection, and treatment of chronic complications. Demonstrates understanding / competency   Patient understands how to develop strategies to address psychosocial issues. Demonstrates understanding / competency   Patient understands how to develop strategies to promote health/change behavior. Demonstrates understanding / competency      Complications   Last HgB A1C per patient/outside source 10 %   How often do you check your blood sugar? 3-4 times/day   Fasting Blood glucose range (mg/dL) 180-200   Postprandial Blood glucose range (mg/dL) >200   Can you tell when your blood sugar is high? Yes   Have you had a dilated eye exam in the past 12 months? Yes     Dietary Intake   Breakfast Cereal and fruit   Lunch 2 eggs and toast and fruit, water   Dinner meat and vegetables or left overs, water     Exercise   Exercise Type ADL's     Patient Education   Nutrition management  Role of diet in the treatment of diabetes and the relationship between the three main macronutrients and blood glucose level;Meal timing in regards to the patients' current diabetes medication.;Meal options for control of blood glucose level and chronic complications.   Physical activity and exercise  Role of exercise on diabetes management, blood pressure control and cardiac health.   Monitoring Purpose and frequency of SMBG.;Taught/discussed recording of test results and interpretation of SMBG.;Daily foot exams   Chronic complications Relationship between chronic complications and blood glucose control   Psychosocial adjustment Identified and addressed patients feelings and concerns about diabetes;Helped patient identify a support system for diabetes management;Worked with patient to identify barriers to care and solutions   Personal strategies to promote health Lifestyle issues that need to be addressed for better diabetes care;Helped patient develop diabetes management plan for (enter comment)     Individualized Goals (developed by patient)   Nutrition Follow meal plan discussed;General guidelines for healthy choices and portions discussed;Adjust meds/carbs with exercise as  discussed   Medications take my medication as prescribed   Monitoring  test my blood glucose as discussed   Reducing Risk examine blood glucose patterns;increase portions of  olive oil in diet;increase portions of healthy fats;treat hypoglycemia with 15 grams of carbs if blood glucose less than '70mg'$ /dL;do foot checks daily   Health Coping discuss diabetes with (comment)     Patient Self-Evaluation of Goals - Patient rates self as meeting previously set goals (% of time)   Nutrition 50 - 75 %   Physical Activity < 25%   Medications >75%   Monitoring >75%   Problem Solving >75%   Reducing Risk >75%   Health Coping >75%     Post-Education Assessment   Patient understands the diabetes disease and treatment process. Demonstrates understanding / competency   Patient understands incorporating nutritional management into lifestyle. Needs Review   Patient undertands incorporating physical activity into lifestyle. Demonstrates understanding / competency   Patient understands using medications safely. Demonstrates understanding / competency   Patient understands monitoring blood glucose, interpreting and using results Demonstrates understanding / competency   Patient understands prevention, detection, and treatment of acute complications. Demonstrates understanding / competency   Patient understands prevention, detection, and treatment of chronic complications. Demonstrates understanding / competency   Patient understands how to develop strategies to address psychosocial issues. Demonstrates understanding / competency   Patient understands how to develop strategies to promote health/change behavior. Demonstrates understanding / competency     Outcomes   Program Status Completed      Learning Objective:  Patient will have a greater understanding of diabetes self-management. Patient education plan is to attend individual and/or group sessions per assessed needs and concerns.   Plan:  Goals Eat three meals per day Eat 45-60 grams of carbs with protein and vegetables at each meal Do not take meal time insulin if you blood sugar is 90 or less before the meal.   Increase low carb vegetables. Keep drinking water Eat meals on time as discussed Take insulin as prescribed.   Expected Outcomes:  Demonstrated interest in learning. Expect positive outcomes  Education material provided: My Plate and Carbohydrate counting sheet  If problems or questions, patient to contact team via:  Phone and Email  Future DSME appointment: - 4-6 wks

## 2016-07-29 NOTE — Patient Instructions (Signed)
Goals Eat three meals per day Eat 45-60 grams of carbs with protein and vegetables at each meal Do not take meal time insulin if you blood sugar is 90 or less before the meal.  Increase low carb vegetables. Keep drinking water Eat meals on time as discussed Take insulin as prescribed.

## 2016-07-29 NOTE — Progress Notes (Signed)
Subjective:    Patient ID: Barry Turner, male    DOB: 09-18-1940. Patient is being seen in f/u for management of diabetes requested by  Quentin Cornwall, MD  Past Medical History:  Diagnosis Date  . CHF (congestive heart failure) ([place])   . COPD (chronic obstructive pulmonary disease) ([place])   . Coronary artery disease   . Diabetes mellitus, type II ([place])   . Dysthymic disorder   . Hyperlipidemia   . Hypothyroidism    Past Surgical History:  Procedure Laterality Date  . CHOLECYSTECTOMY    . CORONARY ARTERY BYPASS GRAFT    . MEDIASTINOSCOPY     Social History   Social History  . Marital status: Married    Spouse name: N/A  . Number of children: N/A  . Years of education: N/A   Social History Main Topics  . Smoking status: Never Smoker  . Smokeless tobacco: Never Used  . Alcohol use No  . Drug use: No  . Sexual activity: Not Asked   Other Topics Concern  . None   Social History Narrative  . None   Outpatient Encounter Prescriptions as of 07/29/2016  Medication Sig  . amoxicillin (AMOXIL) 500 MG capsule Take 500 mg by mouth as directed.  Marland Kitchen apixaban (ELIQUIS) 5 MG TABS tablet Take 5 mg by mouth 2 (two) times daily.  Marland Kitchen aspirin EC 81 MG tablet Take 81 mg by mouth daily.  . carvedilol (COREG) 12.5 MG tablet Take 12.5 mg by mouth 2 (two) times daily with a meal.  . clarithromycin (BIAXIN) 500 MG tablet Take 500 mg by mouth as directed.  Marland Kitchen esomeprazole (NEXIUM) 40 MG capsule Take 40 mg by mouth daily at 12 noon.  . Fluticasone-Salmeterol (ADVAIR) 250-50 MCG/DOSE AEPB Inhale 1 puff into the lungs 2 (two) times daily as needed.  . furosemide (LASIX) 20 MG tablet Take 20 mg by mouth 2 (two) times daily.  Marland Kitchen ibuprofen (ADVIL,MOTRIN) 800 MG tablet Take 800 mg by mouth every 8 (eight) hours as needed.  . insulin aspart (NOVOLOG FLEXPEN) 100 UNIT/ML FlexPen Inject 10-16 Units into the skin 3 (three) times daily with meals.  . insulin degludec (TRESIBA FLEXTOUCH) 100  UNIT/ML SOPN FlexTouch Pen Inject 0.44 mLs (44 Units total) into the skin daily at 10 pm.  . ipratropium-albuterol (DUONEB) 0.5-2.5 (3) MG/3ML SOLN Take 3 mLs by nebulization as directed.  . isosorbide dinitrate (ISORDIL) 30 MG tablet Take 30 mg by mouth daily.  Marland Kitchen levothyroxine (SYNTHROID, LEVOTHROID) 50 MCG tablet Take 50 mcg by mouth daily before breakfast.  . LORazepam (ATIVAN) 0.5 MG tablet Take 0.5 mg by mouth at bedtime.  Marland Kitchen losartan (COZAAR) 100 MG tablet Take 100 mg by mouth daily.  . meclizine (ANTIVERT) 25 MG tablet Take 25 mg by mouth 2 (two) times daily as needed for dizziness.  . nitroGLYCERIN (NITROSTAT) 0.4 MG SL tablet Place 0.4 mg under the tongue every 5 (five) minutes as needed for chest pain.  . potassium chloride (K-DUR) 10 MEQ tablet Take 10 mEq by mouth daily.  . predniSONE (DELTASONE) 10 MG tablet Take 10 mg by mouth daily with breakfast.  . ranitidine (ZANTAC) 150 MG capsule Take 150 mg by mouth 2 (two) times daily.  . TERBINAFINE HCL PO Take by mouth.  . [DISCONTINUED] insulin degludec (TRESIBA FLEXTOUCH) 100 UNIT/ML SOPN FlexTouch Pen Inject 0.4 mLs (40 Units total) into the skin daily at 10 pm.   No facility-administered encounter medications on file as of  07/29/2016.    ALLERGIES: Allergies  Allergen Reactions  . Plavix [Clopidogrel]   . Sucralfate    VACCINATION STATUS:  There is no immunization history on file for this patient.  Diabetes  He presents for his follow-up diabetic visit. He has type 2 diabetes mellitus. Onset time: He was diagnosed at approximate age of 45 years. His disease course has been stable. There are no hypoglycemic associated symptoms. Pertinent negatives for hypoglycemia include no confusion, headaches, pallor or seizures. There are no diabetic associated symptoms. Pertinent negatives for diabetes include no chest pain, no fatigue, no polydipsia, no polyphagia, no polyuria and no weakness. There are no hypoglycemic complications. Symptoms  are stable. Diabetic complications include a CVA and peripheral neuropathy. Risk factors for coronary artery disease include diabetes mellitus, hypertension, obesity and sedentary lifestyle. His weight is stable. He is following a generally unhealthy diet. When asked about meal planning, he reported none. He has not had a previous visit with a dietitian. He participates in exercise intermittently. His home blood glucose trend is fluctuating minimally. His breakfast blood glucose range is generally 180-200 mg/dl. His lunch blood glucose range is generally 180-200 mg/dl. His dinner blood glucose range is generally 180-200 mg/dl. His overall blood glucose range is 180-200 mg/dl. An ACE inhibitor/angiotensin II receptor blocker is being taken.  Hyperlipidemia  This is a chronic problem. The current episode started more than 1 year ago. The problem is uncontrolled. Exacerbating diseases include diabetes and hypothyroidism. Pertinent negatives include no chest pain, myalgias or shortness of breath. He is currently on no antihyperlipidemic treatment. Risk factors for coronary artery disease include diabetes mellitus, dyslipidemia, hypertension, a sedentary lifestyle and male sex.  Hypertension  This is a chronic problem. The current episode started more than 1 year ago. Pertinent negatives include no chest pain, headaches, neck pain, palpitations or shortness of breath. Risk factors for coronary artery disease include diabetes mellitus, dyslipidemia and male gender. Past treatments include angiotensin blockers. Hypertensive end-organ damage includes CVA.     Review of Systems  Constitutional: Negative for chills, fatigue, fever and unexpected weight change.  HENT: Negative for dental problem, mouth sores and trouble swallowing.   Eyes: Negative for visual disturbance.  Respiratory: Negative for cough, choking, chest tightness, shortness of breath and wheezing.   Cardiovascular: Negative for chest pain,  palpitations and leg swelling.  Gastrointestinal: Negative for abdominal distention, abdominal pain, constipation, diarrhea, nausea and vomiting.  Endocrine: Negative for polydipsia, polyphagia and polyuria.  Genitourinary: Negative for dysuria, flank pain, hematuria and urgency.  Musculoskeletal: Negative for back pain, gait problem, myalgias and neck pain.  Skin: Negative for pallor, rash and wound.  Neurological: Negative for seizures, syncope, weakness, numbness and headaches.  Psychiatric/Behavioral: Negative.  Negative for confusion and dysphoric mood.    Objective:    BP 133/68   Pulse 63   Ht 6' (1.829 m)   Wt 219 lb (99.3 kg)   BMI 29.70 kg/m   Wt Readings from Last 3 Encounters:  07/29/16 219 lb (99.3 kg)  07/29/16 219 lb (99.3 kg)  06/10/16 214 lb (97.1 kg)    Physical Exam  Constitutional: He is oriented to person, place, and time. He appears well-developed and well-nourished. He is cooperative. No distress.  HENT:  Head: Normocephalic and atraumatic.  Eyes: EOM are normal.  Neck: Normal range of motion. Neck supple. No tracheal deviation present. No thyromegaly present.  Cardiovascular: Normal rate, S1 normal, S2 normal and normal heart sounds.  Exam reveals no gallop.  No murmur heard. Pulses:      Dorsalis pedis pulses are 1+ on the right side, and 1+ on the left side.       Posterior tibial pulses are 1+ on the right side, and 1+ on the left side.  Pulmonary/Chest: No respiratory distress. He has wheezes. He has rales.  Abdominal: Soft. Bowel sounds are normal. He exhibits no distension. There is no tenderness. There is no guarding and no CVA tenderness.  Musculoskeletal: He exhibits no edema.       Right shoulder: He exhibits no swelling and no deformity.  Neurological: He is alert and oriented to person, place, and time. He has normal strength and normal reflexes. No cranial nerve deficit or sensory deficit. Gait normal.  Skin: Skin is warm and dry. No rash  noted. No cyanosis. Nails show no clubbing.  Psychiatric: He has a normal mood and affect. His speech is normal and behavior is normal. Judgment and thought content normal. Cognition and memory are normal.    Diabetic Labs (most recent): Lab Results  Component Value Date   HGBA1C 10 04/15/2016     Lipid Panel ( most recent) Lipid Panel     Component Value Date/Time   CHOL 175 04/15/2016   TRIG 137 04/15/2016   HDL 45 04/15/2016   LDLCALC 107 04/15/2016    Assessment & Plan:   1. DM type 2 causing vascular disease ([place])  - Patient has currently uncontrolled symptomatic type 2 DM since 76 years of age. - He came with A1c slightly better at 9.4% from 10 %. Recent labs reviewed. -  He came with fluctuating blood glucose profile, no major hypoglycemia nor  Persistent hyperglycemia. - He didn't have interim hospitalization due to respiratory failure which required increase in the dose of steroids.   - his diabetes is complicated by CVA and patient remains at a high risk for more acute and chronic complications of diabetes which include CAD, CVA, CKD, retinopathy, and neuropathy. These are all discussed in detail with the patient.  - I have counseled the patient on diet management and weight loss, by adopting a carbohydrate restricted/protein rich diet.  - Suggestion is made for patient to avoid simple carbohydrates   from their diet including Cakes , Desserts, Ice Cream,  Soda (  diet and regular) , Sweet Tea , Candies,  Chips, Cookies, Artificial Sweeteners,   and "Sugar-free" Products . This will help patient to have stable blood glucose profile and potentially avoid unintended weight gain.  - I encouraged the patient to switch to  unprocessed or minimally processed complex starch and increased protein intake (animal or plant source), fruits, and vegetables.  - Patient is advised to stick to a routine mealtimes to eat 3 meals  a day and avoid unnecessary snacks ( to snack only to  correct hypoglycemia).  - The patient will be scheduled with Jearld Fenton, RDN, CDE for individualized DM education.  - I have approached patient with the following individualized plan to manage diabetes and patient agrees:   -  He is taking prednisone 10 mg by mouth every a.m. for chronic COPD. I advised him to avoid sudden stopping of prednisone to prevent adrenal crisis.  - He will need significant adjustment in his basal/bolus insulin. The main goal of therapy in this patient would be avoiding hypoglycemia due to advanced age. -  I  will increase Tresiba to 44 units daily at bedtime, continue NovoLog 10 units 3 times a day before meals  for premeal BG readings of 90-'150mg'$ /dl, plus patient specific correction dose for unexpected hyperglycemia above '150mg'$ /dl, associated with strict monitoring of glucose  AC and HS. - Patient is warned not to take insulin without proper monitoring per orders. -Adjustment parameters are given for hypo and hyperglycemia in writing. -Patient is encouraged to call clinic for blood glucose levels less than 70 or above 300 mg /dl. - Reportedly, he does not tolerate metformin, tried DPPIV  inhibitors before, and like to avoid those medications for now. - His renal function is normal.  - Patient specific target  A1c;  LDL, HDL, Triglycerides, and  Waist Circumference were discussed in detail.  2) BP/HTN: Uncontrolled. Continue current medications including ARB. 3) Lipids/HPL:   Uncontrolled with LDL 107, he is not on statin. He will be considered for low-dose statin on subsequent visit.  4)  Weight/Diet: CDE Consult will be initiated , exercise, and detailed carbohydrates information provided.  5) hypothyroidism:  - His recent thyroid function tests are consistent with appropriate replacement.  -  He is on levothyroxine 50 g by mouth every morning.  - We discussed about correct intake of levothyroxine, at fasting, with water, separated by at least 30 minutes from  breakfast, and separated by more than 4 hours from calcium, iron, multivitamins, acid reflux medications (PPIs). -Patient is made aware of the fact that thyroid hormone replacement is needed for life, dose to be adjusted by periodic monitoring of thyroid function tests.  6) Chronic Care/Health Maintenance:  -Patient is on ARB and encouraged to continue to follow up with Ophthalmology, Podiatrist at least yearly or according to recommendations, and advised to   stay away from smoking. I have recommended yearly flu vaccine and pneumonia vaccination at least every 5 years; moderate intensity exercise for up to 150 minutes weekly; and  sleep for at least 7 hours a day.  - 30 minutes of time was spent on the care of this patient , 50% of which was applied for counseling on diabetes complications and their preventions.  - Patient to bring meter and  blood glucose logs during his next visit.   - I advised patient to maintain close follow up with Quentin Cornwall, MD for primary care needs.  Follow up plan: - Return for meter, and logs.  Glade Lloyd, MD Phone: [phone]  Fax: [phone]   07/29/2016, 3:07 PM

## 2016-10-29 ENCOUNTER — Encounter: Payer: Self-pay | Admitting: "Endocrinology

## 2016-10-29 ENCOUNTER — Ambulatory Visit: Payer: Medicare Other | Admitting: Nutrition

## 2016-10-29 ENCOUNTER — Ambulatory Visit: Payer: Medicare Other | Admitting: "Endocrinology

## 2016-10-29 ENCOUNTER — Ambulatory Visit (INDEPENDENT_AMBULATORY_CARE_PROVIDER_SITE_OTHER): Payer: Medicare Other | Admitting: "Endocrinology

## 2016-10-29 VITALS — BP 121/63 | HR 65 | Wt 222.0 lb

## 2016-10-29 DIAGNOSIS — E1110 Type 2 diabetes mellitus with ketoacidosis without coma: Secondary | ICD-10-CM

## 2016-10-29 DIAGNOSIS — E039 Hypothyroidism, unspecified: Secondary | ICD-10-CM | POA: Diagnosis not present

## 2016-10-29 DIAGNOSIS — I10 Essential (primary) hypertension: Secondary | ICD-10-CM | POA: Diagnosis not present

## 2016-10-29 DIAGNOSIS — E782 Mixed hyperlipidemia: Secondary | ICD-10-CM | POA: Diagnosis not present

## 2016-10-29 MED ORDER — INSULIN DEGLUDEC 100 UNIT/ML ~~LOC~~ SOPN: 50.0000 [IU] | PEN_INJECTOR | Freq: Every day | SUBCUTANEOUS | 2 refills | Status: DC

## 2016-10-29 MED ORDER — GLUCAGON (RDNA) 1 MG IJ KIT: 1.0000 mg | PACK | Freq: Once | INTRAMUSCULAR | 12 refills | Status: AC | PRN

## 2016-10-29 NOTE — Progress Notes (Signed)
Subjective:    Patient ID: Barry Turner, male    DOB: 04/18/1940. Patient is being seen in f/u for management of diabetes requested by  Quentin Cornwall, MD  Past Medical History:  Diagnosis Date  . CHF (congestive heart failure) ([place])   . COPD (chronic obstructive pulmonary disease) ([place])   . Coronary artery disease   . Diabetes mellitus, type II ([place])   . Dysthymic disorder   . Hyperlipidemia   . Hypothyroidism    Past Surgical History:  Procedure Laterality Date  . CHOLECYSTECTOMY    . CORONARY ARTERY BYPASS GRAFT    . MEDIASTINOSCOPY     Social History   Social History  . Marital status: Married    Spouse name: N/A  . Number of children: N/A  . Years of education: N/A   Social History Main Topics  . Smoking status: Never Smoker  . Smokeless tobacco: Never Used  . Alcohol use No  . Drug use: No  . Sexual activity: Not Asked   Other Topics Concern  . None   Social History Narrative  . None   Outpatient Encounter Prescriptions as of 10/29/2016  Medication Sig  . amoxicillin (AMOXIL) 500 MG capsule Take 500 mg by mouth as directed.  Marland Kitchen apixaban (ELIQUIS) 5 MG TABS tablet Take 5 mg by mouth 2 (two) times daily.  Marland Kitchen aspirin EC 81 MG tablet Take 81 mg by mouth daily.  . carvedilol (COREG) 12.5 MG tablet Take 12.5 mg by mouth 2 (two) times daily with a meal.  . clarithromycin (BIAXIN) 500 MG tablet Take 500 mg by mouth as directed.  Marland Kitchen esomeprazole (NEXIUM) 40 MG capsule Take 40 mg by mouth daily at 12 noon.  . Fluticasone-Salmeterol (ADVAIR) 250-50 MCG/DOSE AEPB Inhale 1 puff into the lungs 2 (two) times daily as needed.  . furosemide (LASIX) 20 MG tablet Take 20 mg by mouth 2 (two) times daily.  Marland Kitchen ibuprofen (ADVIL,MOTRIN) 800 MG tablet Take 800 mg by mouth every 8 (eight) hours as needed.  . insulin aspart (NOVOLOG FLEXPEN) 100 UNIT/ML FlexPen Inject 10-16 Units into the skin 3 (three) times daily with meals.  . insulin degludec (TRESIBA FLEXTOUCH) 100  UNIT/ML SOPN FlexTouch Pen Inject 0.44 mLs (44 Units total) into the skin daily at 10 pm.  . ipratropium-albuterol (DUONEB) 0.5-2.5 (3) MG/3ML SOLN Take 3 mLs by nebulization as directed.  . isosorbide dinitrate (ISORDIL) 30 MG tablet Take 30 mg by mouth daily.  Marland Kitchen levothyroxine (SYNTHROID, LEVOTHROID) 50 MCG tablet Take 50 mcg by mouth daily before breakfast.  . LORazepam (ATIVAN) 0.5 MG tablet Take 0.5 mg by mouth at bedtime.  Marland Kitchen losartan (COZAAR) 100 MG tablet Take 100 mg by mouth daily.  . meclizine (ANTIVERT) 25 MG tablet Take 25 mg by mouth 2 (two) times daily as needed for dizziness.  . nitroGLYCERIN (NITROSTAT) 0.4 MG SL tablet Place 0.4 mg under the tongue every 5 (five) minutes as needed for chest pain.  . potassium chloride (K-DUR) 10 MEQ tablet Take 10 mEq by mouth daily.  . predniSONE (DELTASONE) 10 MG tablet Take 10 mg by mouth daily with breakfast.  . ranitidine (ZANTAC) 150 MG capsule Take 150 mg by mouth 2 (two) times daily.  . TERBINAFINE HCL PO Take by mouth.   No facility-administered encounter medications on file as of 10/29/2016.    ALLERGIES: Allergies  Allergen Reactions  . Plavix [Clopidogrel]   . Sucralfate    VACCINATION STATUS:  There is  no immunization history on file for this patient.  Diabetes  He presents for his follow-up diabetic visit. He has type 2 diabetes mellitus. Onset time: He was diagnosed at approximate age of 41 years. His disease course has been stable. There are no hypoglycemic associated symptoms. Pertinent negatives for hypoglycemia include no confusion, headaches, pallor or seizures. There are no diabetic associated symptoms. Pertinent negatives for diabetes include no chest pain, no fatigue, no polydipsia, no polyphagia, no polyuria and no weakness. There are no hypoglycemic complications. Symptoms are stable. Diabetic complications include a CVA and peripheral neuropathy. Risk factors for coronary artery disease include diabetes mellitus,  hypertension, obesity and sedentary lifestyle. His weight is stable. He is following a generally unhealthy diet. When asked about meal planning, he reported none. He has not had a previous visit with a dietitian. He participates in exercise intermittently. His home blood glucose trend is fluctuating minimally. His breakfast blood glucose range is generally >200 mg/dl. His lunch blood glucose range is generally 180-200 mg/dl. His dinner blood glucose range is generally 180-200 mg/dl. His bedtime blood glucose range is generally >200 mg/dl. His overall blood glucose range is 180-200 mg/dl. An ACE inhibitor/angiotensin II receptor blocker is being taken.  Hyperlipidemia  This is a chronic problem. The current episode started more than 1 year ago. The problem is uncontrolled. Exacerbating diseases include diabetes and hypothyroidism. Pertinent negatives include no chest pain, myalgias or shortness of breath. He is currently on no antihyperlipidemic treatment. Risk factors for coronary artery disease include diabetes mellitus, dyslipidemia, hypertension, a sedentary lifestyle and male sex.  Hypertension  This is a chronic problem. The current episode started more than 1 year ago. Pertinent negatives include no chest pain, headaches, neck pain, palpitations or shortness of breath. Risk factors for coronary artery disease include diabetes mellitus, dyslipidemia and male gender. Past treatments include angiotensin blockers. Hypertensive end-organ damage includes CVA.     Review of Systems  Constitutional: Negative for chills, fatigue, fever and unexpected weight change.  HENT: Negative for dental problem, mouth sores and trouble swallowing.   Eyes: Negative for visual disturbance.  Respiratory: Negative for cough, choking, chest tightness, shortness of breath and wheezing.   Cardiovascular: Negative for chest pain, palpitations and leg swelling.  Gastrointestinal: Negative for abdominal distention, abdominal  pain, constipation, diarrhea, nausea and vomiting.  Endocrine: Negative for polydipsia, polyphagia and polyuria.  Genitourinary: Negative for dysuria, flank pain, hematuria and urgency.  Musculoskeletal: Negative for back pain, gait problem, myalgias and neck pain.  Skin: Negative for pallor, rash and wound.  Neurological: Negative for seizures, syncope, weakness, numbness and headaches.  Psychiatric/Behavioral: Negative.  Negative for confusion and dysphoric mood.    Objective:    BP 121/63   Pulse 65   Wt 222 lb (100.7 kg)   SpO2 95%   BMI 30.11 kg/m   Wt Readings from Last 3 Encounters:  10/29/16 222 lb (100.7 kg)  07/29/16 219 lb (99.3 kg)  07/29/16 219 lb (99.3 kg)    Physical Exam  Constitutional: He is oriented to person, place, and time. He appears well-developed and well-nourished. He is cooperative. No distress.  HENT:  Head: Normocephalic and atraumatic.  Eyes: EOM are normal.  Neck: Normal range of motion. Neck supple. No tracheal deviation present. No thyromegaly present.  Cardiovascular: Normal rate, S1 normal, S2 normal and normal heart sounds.  Exam reveals no gallop.   No murmur heard. Pulses:      Dorsalis pedis pulses are 1+ on the right  side, and 1+ on the left side.       Posterior tibial pulses are 1+ on the right side, and 1+ on the left side.  Pulmonary/Chest: No respiratory distress. He has wheezes. He has rales.  Abdominal: Soft. Bowel sounds are normal. He exhibits no distension. There is no tenderness. There is no guarding and no CVA tenderness.  Musculoskeletal: He exhibits no edema.       Right shoulder: He exhibits no swelling and no deformity.  Neurological: He is alert and oriented to person, place, and time. He has normal strength and normal reflexes. No cranial nerve deficit or sensory deficit. Gait normal.  Skin: Skin is warm and dry. No rash noted. No cyanosis. Nails show no clubbing.  Psychiatric: He has a normal mood and affect. His speech  is normal and behavior is normal. Judgment and thought content normal. Cognition and memory are normal.    Diabetic Labs (most recent): Lab Results  Component Value Date   HGBA1C 10 04/15/2016     Lipid Panel ( most recent) Lipid Panel     Component Value Date/Time   CHOL 175 04/15/2016   TRIG 137 04/15/2016   HDL 45 04/15/2016   LDLCALC 107 04/15/2016    Assessment & Plan:   1. DM type 2 causing vascular disease ([place])  - Patient has currently uncontrolled symptomatic type 2 DM since 76 years of age. - His recent labs were done at his nephrology clinic, not reported for review today. During his last visit A1c was 9.4%.  -  He came with fluctuating blood glucose profile, no major hypoglycemia nor  Persistent hyperglycemia.   - his diabetes is complicated by CVA and patient remains at a high risk for more acute and chronic complications of diabetes which include CAD, CVA, CKD, retinopathy, and neuropathy. These are all discussed in detail with the patient.  - I have counseled the patient on diet management and weight loss, by adopting a carbohydrate restricted/protein rich diet.  - Suggestion is made for patient to avoid simple carbohydrates   from his diet including Cakes , Desserts, Ice Cream,  Soda (  diet and regular) , Sweet Tea , Candies,  Chips, Cookies, Artificial Sweeteners,   and "Sugar-free" Products . This will help patient to have stable blood glucose profile and potentially avoid unintended weight gain.  - I encouraged the patient to switch to  unprocessed or minimally processed complex starch and increased protein intake (animal or plant source), fruits, and vegetables.  - Patient is advised to stick to a routine mealtimes to eat 3 meals  a day and avoid unnecessary snacks ( to snack only to correct hypoglycemia).   - I have approached patient with the following individualized plan to manage diabetes and patient agrees:   -  He is taking prednisone 10 mg by mouth  every a.m. for chronic COPD. I advised him to avoid sudden stopping of prednisone to prevent adrenal crisis.  - He will continue to need  basal/bolus insulin. The main goal of therapy in this patient would be avoiding hypoglycemia due to advanced age. -  I  will increase Tresiba to 50 units daily at bedtime, continue NovoLog 10 units 3 times a day before meals for premeal BG readings of 90-165m/dl, plus patient specific correction dose for unexpected hyperglycemia above 1551mdl, associated with strict monitoring of glucose   4 times a day-before meals and at bedtime. - Patient is warned not to take insulin without proper monitoring  per orders. -Adjustment parameters are given for hypo and hyperglycemia in writing. -Patient is encouraged to call clinic for blood glucose levels less than 70 or above 300 mg /dl. - Reportedly, he does not tolerate metformin, tried DPPIV  inhibitors before, and like to avoid those medications for now. - I have prescribed glucagon emergency kit. - His renal function is normal.  - Patient specific target  A1c;  LDL, HDL, Triglycerides, and  Waist Circumference were discussed in detail.  2) BP/HTN: controlled. Continue current medications including ARB. 3) Lipids/HPL:   Uncontrolled with LDL 107, he is not on statin. He will be considered for low-dose statin on subsequent visit.  4)  Weight/Diet: CDE Consult has been  initiated , exercise, and detailed carbohydrates information provided.  5) hypothyroidism:  - His recent thyroid function tests are consistent with appropriate replacement.  -  He is on levothyroxine 50 g by mouth every morning.  - We discussed about correct intake of levothyroxine, at fasting, with water, separated by at least 30 minutes from breakfast, and separated by more than 4 hours from calcium, iron, multivitamins, acid reflux medications (PPIs). -Patient is made aware of the fact that thyroid hormone replacement is needed for life, dose to be  adjusted by periodic monitoring of thyroid function tests.  6) Chronic Care/Health Maintenance:  -Patient is on ARB and encouraged to continue to follow up with Ophthalmology, Podiatrist at least yearly or according to recommendations, and advised to   stay away from smoking. I have recommended yearly flu vaccine and pneumonia vaccination at least every 5 years;  and  sleep for at least 7 hours a day.  - 20 minutes of time was spent on the care of this patient , 50% of which was applied for counseling on diabetes complications and their preventions.  - Patient to bring meter and  blood glucose logs during his next visit.   - I advised patient to maintain close follow up with Quentin Cornwall, MD for primary care needs.  Follow up plan: - Return in about 3 months (around 01/29/2017) for meter, and logs.  Glade Lloyd, MD Phone: [phone]  Fax: [phone]   10/29/2016, 1:58 PM

## 2016-10-29 NOTE — Patient Instructions (Signed)

## 2016-11-13 ENCOUNTER — Ambulatory Visit: Payer: Medicare Other | Admitting: Nutrition

## 2016-12-17 LAB — BASIC METABOLIC PANEL
BUN: 22 — AB (ref 4–21)
CREATININE: 1 (ref <=1.3)

## 2016-12-17 LAB — LIPID PANEL
Cholesterol: 151 (ref 0–200)
HDL: 41 (ref 35–70)
LDL Cholesterol: 89
Triglycerides: 124 (ref 40–160)

## 2016-12-17 LAB — HEMOGLOBIN A1C: HEMOGLOBIN A1C: 9.2

## 2016-12-30 ENCOUNTER — Encounter: Payer: Self-pay | Admitting: "Endocrinology

## 2016-12-30 ENCOUNTER — Ambulatory Visit (INDEPENDENT_AMBULATORY_CARE_PROVIDER_SITE_OTHER): Payer: Medicare Other | Admitting: "Endocrinology

## 2016-12-30 VITALS — BP 150/68 | HR 79 | Ht 72.0 in | Wt 221.0 lb

## 2016-12-30 DIAGNOSIS — E039 Hypothyroidism, unspecified: Secondary | ICD-10-CM | POA: Diagnosis not present

## 2016-12-30 DIAGNOSIS — I10 Essential (primary) hypertension: Secondary | ICD-10-CM | POA: Diagnosis not present

## 2016-12-30 DIAGNOSIS — E1159 Type 2 diabetes mellitus with other circulatory complications: Secondary | ICD-10-CM

## 2016-12-30 DIAGNOSIS — E782 Mixed hyperlipidemia: Secondary | ICD-10-CM

## 2016-12-30 MED ORDER — GLUCOSE BLOOD VI STRP: ORAL_STRIP | 3 refills | Status: DC

## 2016-12-30 MED ORDER — FREESTYLE LIBRE SENSOR SYSTEM MISC: 2 refills | Status: DC

## 2016-12-30 MED ORDER — MICROLET LANCETS MISC: 3 refills | Status: DC

## 2016-12-30 MED ORDER — FREESTYLE LIBRE READER DEVI: 1.0000 | Freq: Once | 0 refills | Status: AC

## 2016-12-30 MED ORDER — INSULIN DEGLUDEC 100 UNIT/ML ~~LOC~~ SOPN: 60.0000 [IU] | PEN_INJECTOR | Freq: Every day | SUBCUTANEOUS | 2 refills | Status: DC

## 2016-12-30 NOTE — Progress Notes (Signed)
Subjective:    Patient ID: Barry Turner, male    DOB: 1940/06/22. Patient is being seen in f/u for management of diabetes requested by  Quentin Cornwall, MD  Past Medical History:  Diagnosis Date  . CHF (congestive heart failure) ([place])   . COPD (chronic obstructive pulmonary disease) ([place])   . Coronary artery disease   . Diabetes mellitus, type II ([place])   . Dysthymic disorder   . Hyperlipidemia   . Hypothyroidism    Past Surgical History:  Procedure Laterality Date  . CHOLECYSTECTOMY    . CORONARY ARTERY BYPASS GRAFT    . MEDIASTINOSCOPY     Social History   Social History  . Marital status: Married    Spouse name: N/A  . Number of children: N/A  . Years of education: N/A   Social History Main Topics  . Smoking status: Never Smoker  . Smokeless tobacco: Never Used  . Alcohol use No  . Drug use: No  . Sexual activity: Not Asked   Other Topics Concern  . None   Social History Narrative  . None   Outpatient Encounter Prescriptions as of 12/30/2016  Medication Sig  . amoxicillin (AMOXIL) 500 MG capsule Take 500 mg by mouth as directed.  Marland Kitchen apixaban (ELIQUIS) 5 MG TABS tablet Take 5 mg by mouth 2 (two) times daily.  Marland Kitchen aspirin EC 81 MG tablet Take 81 mg by mouth daily.  . carvedilol (COREG) 12.5 MG tablet Take 12.5 mg by mouth 2 (two) times daily with a meal.  . clarithromycin (BIAXIN) 500 MG tablet Take 500 mg by mouth as directed.  . Continuous Blood Gluc Receiver (FREESTYLE LIBRE READER) DEVI 1 Piece by Does not apply route once.  . Continuous Blood Gluc Sensor (FREESTYLE LIBRE SENSOR SYSTEM) MISC Use one sensor every 10 days.  Marland Kitchen esomeprazole (NEXIUM) 40 MG capsule Take 40 mg by mouth daily at 12 noon.  . Fluticasone-Salmeterol (ADVAIR) 250-50 MCG/DOSE AEPB Inhale 1 puff into the lungs 2 (two) times daily as needed.  . furosemide (LASIX) 20 MG tablet Take 20 mg by mouth 2 (two) times daily.  Marland Kitchen glucagon (GLUCAGON EMERGENCY) 1 MG injection Inject 1 mg into  the vein once as needed.  Marland Kitchen glucose blood (CONTOUR NEXT TEST) test strip Use to test blood glucose 4 times a day.  . ibuprofen (ADVIL,MOTRIN) 800 MG tablet Take 800 mg by mouth every 8 (eight) hours as needed.  . insulin aspart (NOVOLOG FLEXPEN) 100 UNIT/ML FlexPen Inject 10-16 Units into the skin 3 (three) times daily with meals.  . insulin degludec (TRESIBA FLEXTOUCH) 100 UNIT/ML SOPN FlexTouch Pen Inject 0.6 mLs (60 Units total) into the skin daily at 10 pm.  . ipratropium-albuterol (DUONEB) 0.5-2.5 (3) MG/3ML SOLN Take 3 mLs by nebulization as directed.  . isosorbide dinitrate (ISORDIL) 30 MG tablet Take 30 mg by mouth daily.  Marland Kitchen levothyroxine (SYNTHROID, LEVOTHROID) 50 MCG tablet Take 50 mcg by mouth daily before breakfast.  . LORazepam (ATIVAN) 0.5 MG tablet Take 0.5 mg by mouth at bedtime.  Marland Kitchen losartan (COZAAR) 100 MG tablet Take 100 mg by mouth daily.  . meclizine (ANTIVERT) 25 MG tablet Take 25 mg by mouth 2 (two) times daily as needed for dizziness.  Marland Kitchen MICROLET LANCETS MISC Use 4 times a day to test blood glucose.  . nitroGLYCERIN (NITROSTAT) 0.4 MG SL tablet Place 0.4 mg under the tongue every 5 (five) minutes as needed for chest pain.  . potassium chloride (  K-DUR) 10 MEQ tablet Take 10 mEq by mouth daily.  . predniSONE (DELTASONE) 10 MG tablet Take 10 mg by mouth daily with breakfast.  . ranitidine (ZANTAC) 150 MG capsule Take 150 mg by mouth 2 (two) times daily.  . TERBINAFINE HCL PO Take by mouth.  . [DISCONTINUED] insulin degludec (TRESIBA FLEXTOUCH) 100 UNIT/ML SOPN FlexTouch Pen Inject 0.5 mLs (50 Units total) into the skin daily at 10 pm.   No facility-administered encounter medications on file as of 12/30/2016.    ALLERGIES: Allergies  Allergen Reactions  . Plavix [Clopidogrel]   . Sucralfate    VACCINATION STATUS:  There is no immunization history on file for this patient.  Diabetes  He presents for his follow-up diabetic visit. He has type 2 diabetes mellitus. Onset  time: He was diagnosed at approximate age of 42 years. His disease course has been stable. There are no hypoglycemic associated symptoms. Pertinent negatives for hypoglycemia include no confusion, headaches, pallor or seizures. There are no diabetic associated symptoms. Pertinent negatives for diabetes include no chest pain, no fatigue, no polydipsia, no polyphagia, no polyuria and no weakness. There are no hypoglycemic complications. Symptoms are stable. Diabetic complications include a CVA and peripheral neuropathy. Risk factors for coronary artery disease include diabetes mellitus, hypertension, obesity and sedentary lifestyle. His weight is stable. He is following a generally unhealthy diet. When asked about meal planning, he reported none. He has not had a previous visit with a dietitian. He participates in exercise intermittently. His home blood glucose trend is fluctuating minimally. His breakfast blood glucose range is generally >200 mg/dl. His lunch blood glucose range is generally >200 mg/dl. His dinner blood glucose range is generally >200 mg/dl. His bedtime blood glucose range is generally >200 mg/dl. His overall blood glucose range is >200 mg/dl. An ACE inhibitor/angiotensin II receptor blocker is being taken.  Hyperlipidemia  This is a chronic problem. The current episode started more than 1 year ago. The problem is uncontrolled. Exacerbating diseases include diabetes and hypothyroidism. Pertinent negatives include no chest pain, myalgias or shortness of breath. He is currently on no antihyperlipidemic treatment. Risk factors for coronary artery disease include diabetes mellitus, dyslipidemia, hypertension, a sedentary lifestyle and male sex.  Hypertension  This is a chronic problem. The current episode started more than 1 year ago. Pertinent negatives include no chest pain, headaches, neck pain, palpitations or shortness of breath. Risk factors for coronary artery disease include diabetes  mellitus, dyslipidemia and male gender. Past treatments include angiotensin blockers. Hypertensive end-organ damage includes CVA.     Review of Systems  Constitutional: Negative for chills, fatigue, fever and unexpected weight change.  HENT: Negative for dental problem, mouth sores and trouble swallowing.   Eyes: Negative for visual disturbance.  Respiratory: Negative for cough, choking, chest tightness, shortness of breath and wheezing.   Cardiovascular: Negative for chest pain, palpitations and leg swelling.  Gastrointestinal: Negative for abdominal distention, abdominal pain, constipation, diarrhea, nausea and vomiting.  Endocrine: Negative for polydipsia, polyphagia and polyuria.  Genitourinary: Negative for dysuria, flank pain, hematuria and urgency.  Musculoskeletal: Negative for back pain, gait problem, myalgias and neck pain.  Skin: Negative for pallor, rash and wound.  Neurological: Negative for seizures, syncope, weakness, numbness and headaches.  Psychiatric/Behavioral: Negative.  Negative for confusion and dysphoric mood.    Objective:    BP (!) 150/68   Pulse 79   Ht 6' (1.829 m)   Wt 221 lb (100.2 kg)   BMI 29.97 kg/m  Wt Readings from Last 3 Encounters:  12/30/16 221 lb (100.2 kg)  10/29/16 222 lb (100.7 kg)  07/29/16 219 lb (99.3 kg)    Physical Exam  Constitutional: He is oriented to person, place, and time. He appears well-developed and well-nourished. He is cooperative. No distress.  HENT:  Head: Normocephalic and atraumatic.  Eyes: EOM are normal.  Neck: Normal range of motion. Neck supple. No tracheal deviation present. No thyromegaly present.  Cardiovascular: Normal rate, S1 normal, S2 normal and normal heart sounds.  Exam reveals no gallop.   No murmur heard. Pulses:      Dorsalis pedis pulses are 1+ on the right side, and 1+ on the left side.       Posterior tibial pulses are 1+ on the right side, and 1+ on the left side.  Pulmonary/Chest: No  respiratory distress. He has wheezes. He has rales.  Abdominal: Soft. Bowel sounds are normal. He exhibits no distension. There is no tenderness. There is no guarding and no CVA tenderness.  Musculoskeletal: He exhibits no edema.       Right shoulder: He exhibits no swelling and no deformity.  Neurological: He is alert and oriented to person, place, and time. He has normal strength and normal reflexes. No cranial nerve deficit or sensory deficit. Gait normal.  Skin: Skin is warm and dry. No rash noted. No cyanosis. Nails show no clubbing.  Psychiatric: He has a normal mood and affect. His speech is normal and behavior is normal. Judgment and thought content normal. Cognition and memory are normal.    Diabetic Labs (most recent): Lab Results  Component Value Date   HGBA1C 9.2 12/17/2016   HGBA1C 9.4 07/22/2016   HGBA1C 10 04/15/2016     Lipid Panel ( most recent) Lipid Panel     Component Value Date/Time   CHOL 151 12/17/2016   TRIG 124 12/17/2016   HDL 41 12/17/2016   LDLCALC 89 12/17/2016    Assessment & Plan:   1. DM type 2 causing vascular disease ([place])  - Patient has currently uncontrolled symptomatic type 2 DM since 76 years of age. - His recent labs from his PMD's office on 12/17/2016 showed A1c of 9.2%. His logs show average blood glucose of 225.  During his last visit A1c was 9.4%.  -  He came with fluctuating blood glucose profile, no major hypoglycemia .   - his diabetes is complicated by CVA and patient remains at a high risk for more acute and chronic complications of diabetes which include CAD, CVA, CKD, retinopathy, and neuropathy. These are all discussed in detail with the patient.  - I have counseled the patient on diet management and weight loss, by adopting a carbohydrate restricted/protein rich diet.  -  Suggestion is made for him to avoid simple carbohydrates  from his diet including Cakes, Sweet Desserts / Pastries, Ice Cream, Soda (diet and regular),  Sweet Tea, Candies, Chips, Cookies, Store Bought Juices, Alcohol in Excess of  1-2 drinks a day, Artificial Sweeteners, and "Sugar-free" Products. This will help patient to have stable blood glucose profile and potentially avoid unintended weight gain.   - I encouraged the patient to switch to  unprocessed or minimally processed complex starch and increased protein intake (animal or plant source), fruits, and vegetables.  - Patient is advised to stick to a routine mealtimes to eat 3 meals  a day and avoid unnecessary snacks ( to snack only to correct hypoglycemia).   - I have approached patient  with the following individualized plan to manage diabetes and patient agrees:   -  He is taking prednisone 10 mg by mouth every a.m. for chronic COPD. I advised him to avoid sudden stopping of prednisone to prevent adrenal crisis.  - He will continue to need  basal/bolus insulin. The main goal of therapy in this patient would be avoiding hypoglycemia due to advanced age. -  I  will increase Tresiba to 60 units daily at bedtime, continue NovoLog 10 units 3 times a day before meals for premeal BG readings of 90-161m/dl, plus patient specific correction dose for unexpected hyperglycemia above 15106mdl, associated with strict monitoring of glucose   4 times a day-before meals and at bedtime. - Patient is warned not to take insulin without proper monitoring per orders. -Adjustment parameters are given for hypo and hyperglycemia in writing. -Patient is encouraged to call clinic for blood glucose levels less than 70 or above 300 mg /dl. - He'll benefit from continued glucose monitoring. I have discussed and initiated [facility] - Reportedly, he does not tolerate metformin, tried DPPIV  inhibitors before, and like to avoid those medications for now. - I have prescribed glucagon emergency kit. - His renal function is normal.  - Patient specific target  A1c;  LDL, HDL,  Triglycerides, and  Waist Circumference were discussed in detail.  2) BP/HTN: controlled. Continue current medications including ARB. 3) Lipids/HPL:   Uncontrolled with LDL 107, he is not on statin. He will be considered for low-dose statin on subsequent visit.  4)  Weight/Diet: CDE Consult has been  initiated , exercise, and detailed carbohydrates information provided.  5) hypothyroidism: - His recent labs did not include thyroid function tests -  He is on levothyroxine 50 g by mouth every morning.  - We discussed about correct intake of levothyroxine, at fasting, with water, separated by at least 30 minutes from breakfast, and separated by more than 4 hours from calcium, iron, multivitamins, acid reflux medications (PPIs). -Patient is made aware of the fact that thyroid hormone replacement is needed for life, dose to be adjusted by periodic monitoring of thyroid function tests.  6) Chronic Care/Health Maintenance:  -Patient is on ARB and encouraged to continue to follow up with Ophthalmology, Podiatrist at least yearly or according to recommendations, and advised to   stay away from smoking. I have recommended yearly flu vaccine and pneumonia vaccination at least every 5 years;  and  sleep for at least 7 hours a day.  - Time spent with the patient: 25 min, of which >50% was spent in reviewing his sugar logs , discussing his hypo- and hyper-glycemic episodes, reviewing his current and  previous labs and insulin doses and developing a plan to avoid hypo- and hyper-glycemia.    - I advised patient to maintain close follow up with MiQuentin CornwallMD for primary care needs.  Follow up plan: - Return in about 3 months (around 04/01/2017) for follow up with pre-visit labs, meter, and logs.  GeGlade LloydMD Phone: [phone]: [phone] -  This note was partially dictated with voice recognition software. Similar sounding words can be transcribed inadequately or may not  be corrected  upon review.  12/30/2016, 2:21 PM

## 2016-12-30 NOTE — Patient Instructions (Signed)

## 2017-02-03 ENCOUNTER — Ambulatory Visit: Payer: Medicare Other | Admitting: "Endocrinology

## 2017-02-03 ENCOUNTER — Ambulatory Visit: Payer: Medicare Other | Admitting: Nutrition

## 2017-03-06 ENCOUNTER — Ambulatory Visit: Payer: Medicare Other | Admitting: Nutrition

## 2017-03-06 ENCOUNTER — Ambulatory Visit: Payer: Medicare Other | Admitting: "Endocrinology

## 2017-03-25 ENCOUNTER — Encounter: Payer: Self-pay | Admitting: "Endocrinology

## 2017-03-25 LAB — BASIC METABOLIC PANEL
BUN: 18 (ref 4–21)
Creatinine: 1.2 (ref <=1.3)

## 2017-03-25 LAB — HEMOGLOBIN A1C: Hemoglobin A1C: 8.5

## 2017-03-25 LAB — TSH: TSH: 0.63 (ref <=5.90)

## 2017-04-01 ENCOUNTER — Ambulatory Visit: Payer: Medicare Other | Admitting: "Endocrinology

## 2017-04-25 ENCOUNTER — Encounter: Payer: Self-pay | Admitting: "Endocrinology

## 2017-04-25 ENCOUNTER — Ambulatory Visit: Payer: Medicare Other | Admitting: "Endocrinology

## 2017-04-25 VITALS — BP 155/66 | HR 72 | Ht 72.0 in | Wt 223.0 lb

## 2017-04-25 DIAGNOSIS — E1159 Type 2 diabetes mellitus with other circulatory complications: Secondary | ICD-10-CM | POA: Diagnosis not present

## 2017-04-25 DIAGNOSIS — E039 Hypothyroidism, unspecified: Secondary | ICD-10-CM

## 2017-04-25 DIAGNOSIS — E782 Mixed hyperlipidemia: Secondary | ICD-10-CM

## 2017-04-25 DIAGNOSIS — I10 Essential (primary) hypertension: Secondary | ICD-10-CM

## 2017-04-25 NOTE — Patient Instructions (Signed)

## 2017-04-25 NOTE — Progress Notes (Signed)
Subjective:    Patient ID: Barry Turner, male    DOB: 09-25-1940. Patient is being seen in f/u for management of diabetes requested by  Quentin Cornwall, MD  Past Medical History:  Diagnosis Date  . CHF (congestive heart failure) ([place])   . COPD (chronic obstructive pulmonary disease) ([place])   . Coronary artery disease   . Diabetes mellitus, type II ([place])   . Dysthymic disorder   . Hyperlipidemia   . Hypothyroidism    Past Surgical History:  Procedure Laterality Date  . CHOLECYSTECTOMY    . CORONARY ARTERY BYPASS GRAFT    . MEDIASTINOSCOPY     Social History   Socioeconomic History  . Marital status: Married    Spouse name: None  . Number of children: None  . Years of education: None  . Highest education level: None  Social Needs  . Financial resource strain: None  . Food insecurity - worry: None  . Food insecurity - inability: None  . Transportation needs - medical: None  . Transportation needs - non-medical: None  Occupational History  . None  Tobacco Use  . Smoking status: Never Smoker  . Smokeless tobacco: Never Used  Substance and Sexual Activity  . Alcohol use: No  . Drug use: No  . Sexual activity: None  Other Topics Concern  . None  Social History Narrative  . None   Outpatient Encounter Medications as of 04/25/2017  Medication Sig  . tiotropium (SPIRIVA) 18 MCG inhalation capsule Place 18 mcg into inhaler and inhale daily.  Marland Kitchen amoxicillin (AMOXIL) 500 MG capsule Take 500 mg by mouth as directed.  Marland Kitchen apixaban (ELIQUIS) 5 MG TABS tablet Take 5 mg by mouth 2 (two) times daily.  Marland Kitchen aspirin EC 81 MG tablet Take 81 mg by mouth daily.  . carvedilol (COREG) 12.5 MG tablet Take 12.5 mg by mouth 2 (two) times daily with a meal.  . Continuous Blood Gluc Sensor (FREESTYLE LIBRE SENSOR SYSTEM) MISC Use one sensor every 10 days.  Marland Kitchen esomeprazole (NEXIUM) 40 MG capsule Take 40 mg by mouth daily at 12 noon.  . Fluticasone-Salmeterol (ADVAIR) 250-50 MCG/DOSE AEPB  Inhale 1 puff into the lungs 2 (two) times daily as needed.  . furosemide (LASIX) 20 MG tablet Take 20 mg by mouth 2 (two) times daily.  Marland Kitchen glucagon (GLUCAGON EMERGENCY) 1 MG injection Inject 1 mg into the vein once as needed.  Marland Kitchen glucose blood (CONTOUR NEXT TEST) test strip Use to test blood glucose 4 times a day.  . ibuprofen (ADVIL,MOTRIN) 800 MG tablet Take 800 mg by mouth every 8 (eight) hours as needed.  . insulin aspart (NOVOLOG FLEXPEN) 100 UNIT/ML FlexPen Inject 10-16 Units into the skin 3 (three) times daily with meals.  . insulin degludec (TRESIBA FLEXTOUCH) 100 UNIT/ML SOPN FlexTouch Pen Inject 0.6 mLs (60 Units total) into the skin daily at 10 pm.  . ipratropium-albuterol (DUONEB) 0.5-2.5 (3) MG/3ML SOLN Take 3 mLs by nebulization as directed.  . isosorbide dinitrate (ISORDIL) 30 MG tablet Take 30 mg by mouth daily.  Marland Kitchen levothyroxine (SYNTHROID, LEVOTHROID) 50 MCG tablet Take 50 mcg by mouth daily before breakfast.  . LORazepam (ATIVAN) 0.5 MG tablet Take 0.5 mg by mouth at bedtime.  Marland Kitchen losartan (COZAAR) 100 MG tablet Take 100 mg by mouth daily.  . meclizine (ANTIVERT) 25 MG tablet Take 25 mg by mouth 2 (two) times daily as needed for dizziness.  Marland Kitchen MICROLET LANCETS MISC Use 4 times a  day to test blood glucose.  . nitroGLYCERIN (NITROSTAT) 0.4 MG SL tablet Place 0.4 mg under the tongue every 5 (five) minutes as needed for chest pain.  . potassium chloride (K-DUR) 10 MEQ tablet Take 10 mEq by mouth daily.  . predniSONE (DELTASONE) 10 MG tablet Take 10 mg by mouth daily with breakfast.  . ranitidine (ZANTAC) 150 MG capsule Take 150 mg by mouth 2 (two) times daily.  . TERBINAFINE HCL PO Take by mouth.  . [DISCONTINUED] clarithromycin (BIAXIN) 500 MG tablet Take 500 mg by mouth as directed.   No facility-administered encounter medications on file as of 04/25/2017.    ALLERGIES: Allergies  Allergen Reactions  . Plavix [Clopidogrel]   . Sucralfate    VACCINATION STATUS:  There is no  immunization history on file for this patient.  Diabetes  He presents for his follow-up diabetic visit. He has type 2 diabetes mellitus. Onset time: He was diagnosed at approximate age of 20 years. His disease course has been improving. There are no hypoglycemic associated symptoms. Pertinent negatives for hypoglycemia include no confusion, headaches, pallor or seizures. There are no diabetic associated symptoms. Pertinent negatives for diabetes include no chest pain, no fatigue, no polydipsia, no polyphagia, no polyuria and no weakness. There are no hypoglycemic complications. Symptoms are improving. Diabetic complications include a CVA and peripheral neuropathy. Risk factors for coronary artery disease include diabetes mellitus, hypertension, obesity and sedentary lifestyle. His weight is stable. He is following a generally unhealthy diet. When asked about meal planning, he reported none. He has not had a previous visit with a dietitian. He participates in exercise intermittently. His home blood glucose trend is fluctuating minimally. His breakfast blood glucose range is generally >200 mg/dl. His lunch blood glucose range is generally 180-200 mg/dl. His dinner blood glucose range is generally 180-200 mg/dl. His bedtime blood glucose range is generally >200 mg/dl. His overall blood glucose range is 180-200 mg/dl. An ACE inhibitor/angiotensin II receptor blocker is being taken.  Hyperlipidemia  This is a chronic problem. The current episode started more than 1 year ago. The problem is uncontrolled. Exacerbating diseases include diabetes and hypothyroidism. Pertinent negatives include no chest pain, myalgias or shortness of breath. He is currently on no antihyperlipidemic treatment. Risk factors for coronary artery disease include diabetes mellitus, dyslipidemia, hypertension, a sedentary lifestyle and male sex.  Hypertension  This is a chronic problem. The current episode started more than 1 year ago.  Pertinent negatives include no chest pain, headaches, neck pain, palpitations or shortness of breath. Risk factors for coronary artery disease include diabetes mellitus, dyslipidemia and male gender. Past treatments include angiotensin blockers. Hypertensive end-organ damage includes CVA.     Review of Systems  Constitutional: Negative for chills, fatigue, fever and unexpected weight change.  HENT: Negative for dental problem, mouth sores and trouble swallowing.   Eyes: Negative for visual disturbance.  Respiratory: Negative for cough, choking, chest tightness, shortness of breath and wheezing.   Cardiovascular: Negative for chest pain, palpitations and leg swelling.  Gastrointestinal: Negative for abdominal distention, abdominal pain, constipation, diarrhea, nausea and vomiting.  Endocrine: Negative for polydipsia, polyphagia and polyuria.  Genitourinary: Negative for dysuria, flank pain, hematuria and urgency.  Musculoskeletal: Negative for back pain, gait problem, myalgias and neck pain.  Skin: Negative for pallor, rash and wound.  Neurological: Negative for seizures, syncope, weakness, numbness and headaches.  Psychiatric/Behavioral: Negative.  Negative for confusion and dysphoric mood.    Objective:    BP (!) 155/66  Pulse 72   Ht 6' (1.829 m)   Wt 223 lb (101.2 kg)   BMI 30.24 kg/m   Wt Readings from Last 3 Encounters:  04/25/17 223 lb (101.2 kg)  12/30/16 221 lb (100.2 kg)  10/29/16 222 lb (100.7 kg)    Physical Exam  Constitutional: He is oriented to person, place, and time. He appears well-developed and well-nourished. He is cooperative. No distress.  HENT:  Head: Normocephalic and atraumatic.  Eyes: EOM are normal.  Neck: Normal range of motion. Neck supple. No tracheal deviation present. No thyromegaly present.  Cardiovascular: Normal rate, S1 normal, S2 normal and normal heart sounds. Exam reveals no gallop.  No murmur heard. Pulses:      Dorsalis pedis pulses  are 1+ on the right side, and 1+ on the left side.       Posterior tibial pulses are 1+ on the right side, and 1+ on the left side.  Pulmonary/Chest: No respiratory distress. He has wheezes. He has rales.  Abdominal: Soft. Bowel sounds are normal. He exhibits no distension. There is no tenderness. There is no guarding and no CVA tenderness.  Musculoskeletal: He exhibits no edema.       Right shoulder: He exhibits no swelling and no deformity.  Neurological: He is alert and oriented to person, place, and time. He has normal strength and normal reflexes. No cranial nerve deficit or sensory deficit. Gait normal.  Skin: Skin is warm and dry. No rash noted. No cyanosis. Nails show no clubbing.  Psychiatric: He has a normal mood and affect. His speech is normal and behavior is normal. Judgment and thought content normal. Cognition and memory are normal.    Diabetic Labs (most recent): Lab Results  Component Value Date   HGBA1C 8.5 03/25/2017   HGBA1C 9.2 12/17/2016   HGBA1C 9.4 07/22/2016     Lipid Panel ( most recent) Lipid Panel     Component Value Date/Time   CHOL 151 12/17/2016   TRIG 124 12/17/2016   HDL 41 12/17/2016   LDLCALC 89 12/17/2016    Assessment & Plan:   1. DM type 2 causing vascular disease ([place])  - Patient has currently uncontrolled symptomatic type 2 DM since 77 years of age. - His recent labs show improved A1c of 8.5%, slowly improving from 9.4%.  -No documented no reported episodes of significant hypoglycemia.   - his diabetes is complicated by CVA and patient remains at a high risk for more acute and chronic complications of diabetes which include CAD, CVA, CKD, retinopathy, and neuropathy. These are all discussed in detail with the patient.  - I have counseled the patient on diet management and weight loss, by adopting a carbohydrate restricted/protein rich diet.  -  Suggestion is made for him to avoid simple carbohydrates  from his diet including Cakes,  Sweet Desserts / Pastries, Ice Cream, Soda (diet and regular), Sweet Tea, Candies, Chips, Cookies, Store Bought Juices, Alcohol in Excess of  1-2 drinks a day, Artificial Sweeteners, and "Sugar-free" Products. This will help patient to have stable blood glucose profile and potentially avoid unintended weight gain.   - I encouraged the patient to switch to  unprocessed or minimally processed complex starch and increased protein intake (animal or plant source), fruits, and vegetables.  - Patient is advised to stick to a routine mealtimes to eat 3 meals  a day and avoid unnecessary snacks ( to snack only to correct hypoglycemia).   - I have approached patient with the  following individualized plan to manage diabetes and patient agrees:   -  He is taking prednisone 10 mg by mouth every a.m. for chronic COPD. I advised him to avoid sudden stopping of prednisone to prevent adrenal crisis.  - He will continue to need  basal/bolus insulin. The main goal of therapy in this patient would be avoiding hypoglycemia due to advanced age. -Now that he has a CGM, we can slowly increase his insulin dose. I advised him to increase Tresiba to 70 units nightly,  continue NovoLog 10 units 3 times a day before meals for premeal BG readings of 70-182m/dl, plus patient specific correction dose for unexpected hyperglycemia above 1565mdl, associated with strict monitoring of glucose   4 times a day-before meals and at bedtime. - Patient is warned not to take insulin without proper monitoring per orders. -Adjustment parameters are given for hypo and hyperglycemia in writing. -Patient is encouraged to call clinic for blood glucose levels less than 70 or above 300 mg /dl.  - Reportedly, he does not tolerate metformin, tried DPPIV  inhibitors before, and like to avoid those medications for now. - I have prescribed glucagon emergency kit. - His renal function is normal.  - Patient specific target  A1c;  LDL, HDL,  Triglycerides, and  Waist Circumference were discussed in detail.  2) BP/HTN: His blood pressure this morning is above target at 155/66.  I advised him to continue his current blood pressure medications including  ARB.  3) Lipids/HPL:   Uncontrolled with LDL 107, he is not on statin.  He wishes to avoid any further additional medications at this time.  He will be considered for repeat fasting lipid panel on subsequent visits.    4)  Weight/Diet: CDE Consult has been  initiated , exercise, and detailed carbohydrates information provided.  5) hypothyroidism: -His thyroid function tests are consistent with appropriate replacement. -  He is advised to continue levothyroxine 50 g by mouth every morning.  - We discussed about correct intake of levothyroxine, at fasting, with water, separated by at least 30 minutes from breakfast, and separated by more than 4 hours from calcium, iron, multivitamins, acid reflux medications (PPIs). -Patient is made aware of the fact that thyroid hormone replacement is needed for life, dose to be adjusted by periodic monitoring of thyroid function tests.  6) Chronic Care/Health Maintenance:  -Patient is on ARB and encouraged to continue to follow up with Ophthalmology, Podiatrist at least yearly or according to recommendations, and advised to   stay away from smoking. I have recommended yearly flu vaccine and pneumonia vaccination at least every 5 years;  and  sleep for at least 7 hours a day.  - Time spent with the patient: 25 min, of which >50% was spent in reviewing his blood glucose logs , discussing his hypo- and hyper-glycemic episodes, reviewing his current and  previous labs and insulin doses and developing a plan to avoid hypo- and hyper-glycemia. Please refer to Patient Instructions for Blood Glucose Monitoring and Insulin/Medications Dosing Guide"  in media tab for additional information.   - I advised patient to maintain close follow up with MiQuentin Cornwall MD for primary care needs.  Follow up plan: - Return in about 3 months (around 07/23/2017) for follow up with pre-visit labs, meter, and logs.  GeGlade LloydMD Phone: [phone]: [phone]-  This note was partially dictated with voice recognition software. Similar sounding words can be transcribed inadequately or may not  be corrected upon review.   04/25/2017, 11:00 AM

## 2017-05-07 ENCOUNTER — Other Ambulatory Visit: Payer: Self-pay

## 2017-05-07 MED ORDER — GLUCOSE BLOOD VI STRP: ORAL_STRIP | 3 refills | Status: AC

## 2017-07-04 ENCOUNTER — Other Ambulatory Visit: Payer: Self-pay

## 2017-07-04 MED ORDER — INSULIN DEGLUDEC 100 UNIT/ML ~~LOC~~ SOPN: 70.0000 [IU] | PEN_INJECTOR | Freq: Every day | SUBCUTANEOUS | 2 refills | Status: DC

## 2017-07-07 ENCOUNTER — Other Ambulatory Visit: Payer: Self-pay

## 2017-07-07 MED ORDER — INSULIN DEGLUDEC 100 UNIT/ML ~~LOC~~ SOPN: 70.0000 [IU] | PEN_INJECTOR | Freq: Every day | SUBCUTANEOUS | 2 refills | Status: DC

## 2017-07-22 LAB — HEMOGLOBIN A1C: HEMOGLOBIN A1C: 7.3

## 2017-07-22 LAB — BASIC METABOLIC PANEL
BUN: 18 (ref 4–21)
Creatinine: 1.1 (ref 0.6–1.3)

## 2017-07-23 ENCOUNTER — Ambulatory Visit (INDEPENDENT_AMBULATORY_CARE_PROVIDER_SITE_OTHER): Payer: Medicare Other | Admitting: "Endocrinology

## 2017-07-23 ENCOUNTER — Encounter: Payer: Self-pay | Admitting: "Endocrinology

## 2017-07-23 VITALS — BP 155/72 | HR 62 | Ht 72.0 in | Wt 230.0 lb

## 2017-07-23 DIAGNOSIS — I10 Essential (primary) hypertension: Secondary | ICD-10-CM | POA: Diagnosis not present

## 2017-07-23 DIAGNOSIS — E782 Mixed hyperlipidemia: Secondary | ICD-10-CM

## 2017-07-23 DIAGNOSIS — E1159 Type 2 diabetes mellitus with other circulatory complications: Secondary | ICD-10-CM

## 2017-07-23 DIAGNOSIS — E039 Hypothyroidism, unspecified: Secondary | ICD-10-CM | POA: Diagnosis not present

## 2017-07-23 MED ORDER — INSULIN DEGLUDEC 100 UNIT/ML ~~LOC~~ SOPN: 70.0000 [IU] | PEN_INJECTOR | Freq: Every day | SUBCUTANEOUS | 2 refills | Status: DC

## 2017-07-23 NOTE — Progress Notes (Signed)
  Subjective:    Patient ID: Barry Turner, male    DOB: 10/13/1940. Patient is being seen in f/u for management of diabetes requested by  Milam, James T, MD  Past Medical History:  Diagnosis Date  . CHF (congestive heart failure) (HCC)   . COPD (chronic obstructive pulmonary disease) (HCC)   . Coronary artery disease   . Diabetes mellitus, type II (HCC)   . Dysthymic disorder   . Hyperlipidemia   . Hypothyroidism    Past Surgical History:  Procedure Laterality Date  . CHOLECYSTECTOMY    . CORONARY ARTERY BYPASS GRAFT    . MEDIASTINOSCOPY     Social History   Socioeconomic History  . Marital status: Married    Spouse name: Not on file  . Number of children: Not on file  . Years of education: Not on file  . Highest education level: Not on file  Occupational History  . Not on file  Social Needs  . Financial resource strain: Not on file  . Food insecurity:    Worry: Not on file    Inability: Not on file  . Transportation needs:    Medical: Not on file    Non-medical: Not on file  Tobacco Use  . Smoking status: Never Smoker  . Smokeless tobacco: Never Used  Substance and Sexual Activity  . Alcohol use: No  . Drug use: No  . Sexual activity: Not on file  Lifestyle  . Physical activity:    Days per week: Not on file    Minutes per session: Not on file  . Stress: Not on file  Relationships  . Social connections:    Talks on phone: Not on file    Gets together: Not on file    Attends religious service: Not on file    Active member of club or organization: Not on file    Attends meetings of clubs or organizations: Not on file    Relationship status: Not on file  Other Topics Concern  . Not on file  Social History Narrative  . Not on file   Outpatient Encounter Medications as of 07/23/2017  Medication Sig  . apixaban (ELIQUIS) 5 MG TABS tablet Take 5 mg by mouth 2 (two) times daily.  . aspirin EC 81 MG tablet Take 81 mg by mouth daily.  . carvedilol  (COREG) 12.5 MG tablet Take 12.5 mg by mouth 2 (two) times daily with a meal.  . Continuous Blood Gluc Sensor (FREESTYLE LIBRE SENSOR SYSTEM) MISC Use one sensor every 10 days.  . esomeprazole (NEXIUM) 40 MG capsule Take 40 mg by mouth daily at 12 noon.  . Fluticasone-Salmeterol (ADVAIR) 250-50 MCG/DOSE AEPB Inhale 1 puff into the lungs 2 (two) times daily as needed.  . furosemide (LASIX) 20 MG tablet Take 20 mg by mouth 2 (two) times daily.  . glucagon (GLUCAGON EMERGENCY) 1 MG injection Inject 1 mg into the vein once as needed.  . glucose blood (CONTOUR NEXT TEST) test strip Use to test blood glucose 4 times a day. E11.65  . ibuprofen (ADVIL,MOTRIN) 800 MG tablet Take 800 mg by mouth every 8 (eight) hours as needed.  . insulin aspart (NOVOLOG FLEXPEN) 100 UNIT/ML FlexPen Inject 10-16 Units into the skin 3 (three) times daily with meals.  . insulin degludec (TRESIBA FLEXTOUCH) 100 UNIT/ML SOPN FlexTouch Pen Inject 0.7 mLs (70 Units total) into the skin at bedtime.  . ipratropium-albuterol (DUONEB) 0.5-2.5 (3) MG/3ML SOLN Take 3 mLs by   nebulization as directed.  . isosorbide dinitrate (ISORDIL) 30 MG tablet Take 30 mg by mouth daily.  Marland Kitchen levothyroxine (SYNTHROID, LEVOTHROID) 50 MCG tablet Take 50 mcg by mouth daily before breakfast.  . LORazepam (ATIVAN) 0.5 MG tablet Take 0.5 mg by mouth at bedtime.  Marland Kitchen losartan (COZAAR) 100 MG tablet Take 100 mg by mouth daily.  . meclizine (ANTIVERT) 25 MG tablet Take 25 mg by mouth 2 (two) times daily as needed for dizziness.  Marland Kitchen MICROLET LANCETS MISC Use 4 times a day to test blood glucose.  . nitroGLYCERIN (NITROSTAT) 0.4 MG SL tablet Place 0.4 mg under the tongue every 5 (five) minutes as needed for chest pain.  . potassium chloride (K-DUR) 10 MEQ tablet Take 10 mEq by mouth daily.  . predniSONE (DELTASONE) 10 MG tablet Take 10 mg by mouth daily with breakfast.  . ranitidine (ZANTAC) 150 MG capsule Take 150 mg by mouth 2 (two) times daily.  . TERBINAFINE HCL  PO Take by mouth.  . tiotropium (SPIRIVA) 18 MCG inhalation capsule Place 18 mcg into inhaler and inhale daily.  . [DISCONTINUED] amoxicillin (AMOXIL) 500 MG capsule Take 500 mg by mouth as directed.  . [DISCONTINUED] insulin degludec (TRESIBA FLEXTOUCH) 100 UNIT/ML SOPN FlexTouch Pen Inject 0.7 mLs (70 Units total) into the skin at bedtime. (Patient taking differently: Inject 80 Units into the skin at bedtime. )   No facility-administered encounter medications on file as of 07/23/2017.    ALLERGIES: Allergies  Allergen Reactions  . Plavix [Clopidogrel]   . Sucralfate    VACCINATION STATUS:  There is no immunization history on file for this patient.  Diabetes  He presents for his follow-up diabetic visit. He has type 2 diabetes mellitus. Onset time: He was diagnosed at approximate age of 77 years. His disease course has been improving. There are no hypoglycemic associated symptoms. Pertinent negatives for hypoglycemia include no confusion, headaches, pallor or seizures. There are no diabetic associated symptoms. Pertinent negatives for diabetes include no chest pain, no fatigue, no polydipsia, no polyphagia, no polyuria and no weakness. There are no hypoglycemic complications. Symptoms are worsening. Diabetic complications include a CVA and peripheral neuropathy. Risk factors for coronary artery disease include diabetes mellitus, hypertension, obesity and sedentary lifestyle. His weight is increasing steadily. He is following a generally unhealthy diet. When asked about meal planning, he reported none. He has not had a previous visit with a dietitian. He participates in exercise intermittently. His home blood glucose trend is fluctuating minimally. His breakfast blood glucose range is generally >200 mg/dl. His lunch blood glucose range is generally 140-180 mg/dl. His dinner blood glucose range is generally 180-200 mg/dl. His bedtime blood glucose range is generally >200 mg/dl. His overall blood  glucose range is 180-200 mg/dl. (He wears a CGM device showing 47% time in range, 49% above range, 4% hypoglycemia.  His average blood glucose is 189.  His previsit labs show A1c of 7.3% improving from 8.5%.) An ACE inhibitor/angiotensin II receptor blocker is being taken.  Hyperlipidemia  This is a chronic problem. The current episode started more than 1 year ago. The problem is uncontrolled. Exacerbating diseases include diabetes and hypothyroidism. Pertinent negatives include no chest pain, myalgias or shortness of breath. He is currently on no antihyperlipidemic treatment. Risk factors for coronary artery disease include diabetes mellitus, dyslipidemia, hypertension, a sedentary lifestyle, male sex and obesity.  Hypertension  This is a chronic problem. The current episode started more than 1 year ago. Pertinent negatives include no chest  pain, headaches, neck pain, palpitations or shortness of breath. Risk factors for coronary artery disease include diabetes mellitus, dyslipidemia and male gender. Past treatments include angiotensin blockers. Hypertensive end-organ damage includes CVA.     Review of Systems  Constitutional: Negative for chills, fatigue, fever and unexpected weight change.  HENT: Negative for dental problem, mouth sores and trouble swallowing.   Eyes: Negative for visual disturbance.  Respiratory: Negative for cough, choking, chest tightness, shortness of breath and wheezing.   Cardiovascular: Negative for chest pain, palpitations and leg swelling.  Gastrointestinal: Negative for abdominal distention, abdominal pain, constipation, diarrhea, nausea and vomiting.  Endocrine: Negative for polydipsia, polyphagia and polyuria.  Genitourinary: Negative for dysuria, flank pain, hematuria and urgency.  Musculoskeletal: Negative for back pain, gait problem, myalgias and neck pain.  Skin: Negative for pallor, rash and wound.  Neurological: Negative for seizures, syncope, weakness,  numbness and headaches.  Psychiatric/Behavioral: Negative.  Negative for confusion and dysphoric mood.    Objective:    BP (!) 155/72   Pulse 62   Ht 6' (1.829 m)   Wt 230 lb (104.3 kg)   BMI 31.19 kg/m   Wt Readings from Last 3 Encounters:  07/23/17 230 lb (104.3 kg)  04/25/17 223 lb (101.2 kg)  12/30/16 221 lb (100.2 kg)    Physical Exam  Constitutional: He is oriented to person, place, and time. He appears well-developed and well-nourished. He is cooperative. No distress.  HENT:  Head: Normocephalic and atraumatic.  Eyes: EOM are normal.  Neck: Normal range of motion. Neck supple. No tracheal deviation present. No thyromegaly present.  Cardiovascular: Normal rate, S1 normal, S2 normal and normal heart sounds. Exam reveals no gallop.  No murmur heard. Pulses:      Dorsalis pedis pulses are 1+ on the right side, and 1+ on the left side.       Posterior tibial pulses are 1+ on the right side, and 1+ on the left side.  Pulmonary/Chest: No respiratory distress. He has wheezes. He has rales.  Abdominal: Soft. Bowel sounds are normal. He exhibits no distension. There is no tenderness. There is no guarding and no CVA tenderness.  Musculoskeletal: He exhibits no edema.       Right shoulder: He exhibits no swelling and no deformity.  Neurological: He is alert and oriented to person, place, and time. He has normal strength and normal reflexes. No cranial nerve deficit or sensory deficit. Gait normal.  Skin: Skin is warm and dry. No rash noted. No cyanosis. Nails show no clubbing.  Psychiatric: He has a normal mood and affect. His speech is normal and behavior is normal. Judgment and thought content normal. Cognition and memory are normal.    Diabetic Labs (most recent): Lab Results  Component Value Date   HGBA1C 7.3 07/22/2017   HGBA1C 8.5 03/25/2017   HGBA1C 9.2 12/17/2016     Lipid Panel ( most recent) Lipid Panel     Component Value Date/Time   CHOL 151 12/17/2016   TRIG  124 12/17/2016   HDL 41 12/17/2016   LDLCALC 89 12/17/2016    Assessment & Plan:   1. DM type 2 causing vascular disease (HCC)  - Patient has currently uncontrolled symptomatic type 2 DM since 77 years of age. - His recent labs show improved A1c of 7.3%, progressively improving from 9.4%.   -His CGM device showed 4% hypoglycemia, 49% hypoglycemia, and 47% time range.   -He admits to have been inconsistent and his meal timing.    -   his diabetes is complicated by CVA and patient remains at a high risk for more acute and chronic complications of diabetes which include CAD, CVA, CKD, retinopathy, and neuropathy. These are all discussed in detail with the patient.  - I have counseled the patient on diet management and weight loss, by adopting a carbohydrate restricted/protein rich diet.  -  Suggestion is made for him to avoid simple carbohydrates  from his diet including Cakes, Sweet Desserts / Pastries, Ice Cream, Soda (diet and regular), Sweet Tea, Candies, Chips, Cookies, Store Bought Juices, Alcohol in Excess of  1-2 drinks a day, Artificial Sweeteners, and "Sugar-free" Products. This will help patient to have stable blood glucose profile and potentially avoid unintended weight gain.  - I encouraged the patient to switch to  unprocessed or minimally processed complex starch and increased protein intake (animal or plant source), fruits, and vegetables.  - Patient is advised to stick to a routine mealtimes to eat 3 meals  a day and avoid unnecessary snacks ( to snack only to correct hypoglycemia).   - I have approached patient with the following individualized plan to manage diabetes and patient agrees:   -  He is taking prednisone 10 mg by mouth every a.m. for chronic COPD. I advised him to avoid sudden stopping of prednisone to prevent adrenal crisis.  - He will continue to need  basal/bolus insulin. The main goal of therapy in this patient would be avoiding hypoglycemia due to advanced  age. -He still is not following a routine mealtime.  We had a long discussion about that and he promises to do better.   -I advised him to lower his Tresiba to 70 units nightly, continue NovoLog 10 units 3 times a day before meals for premeal BG readings of 70-150mg/dl, plus patient specific correction dose for unexpected hyperglycemia above 150mg/dl, associated with strict documenting of glucose   4 times a day-before meals and at bedtime. - Patient is warned not to take insulin without proper monitoring per orders. -Adjustment parameters are given for hypo and hyperglycemia in writing. -Patient is encouraged to call clinic for blood glucose levels less than 70 or above 300 mg /dl.  - Reportedly, he does not tolerate metformin, tried DPPIV  inhibitors before, and like to avoid those medications for now. - I have prescribed glucagon emergency kit. - His renal function is normal.  - Patient specific target  A1c;  LDL, HDL, Triglycerides, and  Waist Circumference were discussed in detail.  2) BP/HTN: His blood pressure this morning is above target at 155/72.  He claims to have whitecoat hypertension.  I advised him to continue his current blood pressure medications including  ARB.  3) Lipids/HPL:   Uncontrolled with LDL 107, he is not on statin.  He wishes to avoid any further additional medications at this time.  He will be considered for repeat fasting lipid panel on subsequent visits.    4)  Weight/Diet: CDE Consult has been  initiated , exercise, and detailed carbohydrates information provided.  5) hypothyroidism: -His previsit labs did not include thyroid function test.  I advised him to continue levothyroxine 50 mcg p.o. every morning.   - We discussed about correct intake of levothyroxine, at fasting, with water, separated by at least 30 minutes from breakfast, and separated by more than 4 hours from calcium, iron, multivitamins, acid reflux medications (PPIs). -Patient is made aware of  the fact that thyroid hormone replacement is needed for life, dose to be adjusted by   periodic monitoring of thyroid function tests.   6) Chronic Care/Health Maintenance:  -Patient is on ARB and encouraged to continue to follow up with Ophthalmology, Podiatrist at least yearly or according to recommendations, and advised to   stay away from smoking. I have recommended yearly flu vaccine and pneumonia vaccination at least every 5 years;  and  sleep for at least 7 hours a day. - I advised patient to maintain close follow up with Quentin Cornwall, MD for primary care needs. - Time spent with the patient: 25 min, of which >50% was spent in reviewing his blood glucose logs , discussing his hypo- and hyper-glycemic episodes, reviewing his current and  previous labs and insulin doses and developing a plan to avoid hypo- and hyper-glycemia. Please refer to Patient Instructions for Blood Glucose Monitoring and Insulin/Medications Dosing Guide"  in media tab for additional information. Barry Turner participated in the discussions, expressed understanding, and voiced agreement with the above plans.  All questions were answered to his satisfaction. he is encouraged to contact clinic should he have any questions or concerns prior to his return visit.   Follow up plan: - Return in about 3 months (around 10/23/2017) for meter, and logs, follow up with pre-visit labs, meter, and logs.  Glade Lloyd, MD Phone: [phone]  Fax: [phone]  -  This note was partially dictated with voice recognition software. Similar sounding words can be transcribed inadequately or may not  be corrected upon review.   07/23/2017, 5:22 PM

## 2017-07-23 NOTE — Patient Instructions (Signed)

## 2017-10-23 ENCOUNTER — Encounter: Payer: Self-pay | Admitting: "Endocrinology

## 2017-10-23 ENCOUNTER — Ambulatory Visit: Payer: Medicare Other | Admitting: "Endocrinology

## 2017-10-23 VITALS — BP 142/70 | HR 70 | Ht 72.0 in | Wt 222.1 lb

## 2017-10-23 DIAGNOSIS — I10 Essential (primary) hypertension: Secondary | ICD-10-CM

## 2017-10-23 DIAGNOSIS — E782 Mixed hyperlipidemia: Secondary | ICD-10-CM | POA: Diagnosis not present

## 2017-10-23 DIAGNOSIS — E039 Hypothyroidism, unspecified: Secondary | ICD-10-CM

## 2017-10-23 DIAGNOSIS — E1159 Type 2 diabetes mellitus with other circulatory complications: Secondary | ICD-10-CM

## 2017-10-23 NOTE — Patient Instructions (Signed)

## 2017-10-23 NOTE — Progress Notes (Signed)
Endocrinology follow-up note  Subjective:    Patient ID: Barry Turner, male    DOB: [birth year]. Patient is being seen in f/u for management of diabetes requested by  Quentin Cornwall, MD  Past Medical History:  Diagnosis Date  . CHF (congestive heart failure) ([place])   . COPD (chronic obstructive pulmonary disease) ([place])   . Coronary artery disease   . Diabetes mellitus, type II ([place])   . Dysthymic disorder   . Hyperlipidemia   . Hypothyroidism    Past Surgical History:  Procedure Laterality Date  . CHOLECYSTECTOMY    . CORONARY ARTERY BYPASS GRAFT    . MEDIASTINOSCOPY     Social History   Socioeconomic History  . Marital status: Married    Spouse name: Not on file  . Number of children: Not on file  . Years of education: Not on file  . Highest education level: Not on file  Occupational History  . Not on file  Social Needs  . Financial resource strain: Not on file  . Food insecurity:    Worry: Not on file    Inability: Not on file  . Transportation needs:    Medical: Not on file    Non-medical: Not on file  Tobacco Use  . Smoking status: Never Smoker  . Smokeless tobacco: Never Used  Substance and Sexual Activity  . Alcohol use: No  . Drug use: No  . Sexual activity: Not on file  Lifestyle  . Physical activity:    Days per week: Not on file    Minutes per session: Not on file  . Stress: Not on file  Relationships  . Social connections:    Talks on phone: Not on file    Gets together: Not on file    Attends religious service: Not on file    Active member of club or organization: Not on file    Attends meetings of clubs or organizations: Not on file    Relationship status: Not on file  Other Topics Concern  . Not on file  Social History Narrative  . Not on file   Outpatient Encounter Medications as of 10/23/2017  Medication Sig  . apixaban (ELIQUIS) 5 MG TABS tablet Take 5 mg by mouth 2 (two) times daily.  Marland Kitchen aspirin EC 81 MG tablet Take 81 mg by  mouth daily.  . carvedilol (COREG) 12.5 MG tablet Take 12.5 mg by mouth 2 (two) times daily with a meal.  . Continuous Blood Gluc Sensor (FREESTYLE LIBRE SENSOR SYSTEM) MISC Use one sensor every 10 days.  Marland Kitchen esomeprazole (NEXIUM) 40 MG capsule Take 40 mg by mouth daily at 12 noon.  . Fluticasone-Salmeterol (ADVAIR) 250-50 MCG/DOSE AEPB Inhale 1 puff into the lungs 2 (two) times daily as needed.  . furosemide (LASIX) 20 MG tablet Take 20 mg by mouth 2 (two) times daily.  Marland Kitchen ibuprofen (ADVIL,MOTRIN) 800 MG tablet Take 800 mg by mouth every 8 (eight) hours as needed.  . insulin aspart (NOVOLOG FLEXPEN) 100 UNIT/ML FlexPen Inject 10-16 Units into the skin 3 (three) times daily with meals.  . insulin degludec (TRESIBA FLEXTOUCH) 100 UNIT/ML SOPN FlexTouch Pen Inject 0.7 mLs (70 Units total) into the skin at bedtime.  Marland Kitchen ipratropium-albuterol (DUONEB) 0.5-2.5 (3) MG/3ML SOLN Take 3 mLs by nebulization as directed.  . isosorbide dinitrate (ISORDIL) 30 MG tablet Take 30 mg by mouth daily.  Marland Kitchen levothyroxine (SYNTHROID, LEVOTHROID) 50 MCG tablet Take 50 mcg by mouth daily before breakfast.  .  LORazepam (ATIVAN) 0.5 MG tablet Take 0.5 mg by mouth at bedtime.  Marland Kitchen losartan (COZAAR) 100 MG tablet Take 100 mg by mouth daily.  . meclizine (ANTIVERT) 25 MG tablet Take 25 mg by mouth 2 (two) times daily as needed for dizziness.  Marland Kitchen MICROLET LANCETS MISC Use 4 times a day to test blood glucose.  . nitroGLYCERIN (NITROSTAT) 0.4 MG SL tablet Place 0.4 mg under the tongue every 5 (five) minutes as needed for chest pain.  . potassium chloride (K-DUR) 10 MEQ tablet Take 10 mEq by mouth daily.  . predniSONE (DELTASONE) 10 MG tablet Take 10 mg by mouth daily with breakfast.  . ranitidine (ZANTAC) 150 MG capsule Take 150 mg by mouth 2 (two) times daily.  Marland Kitchen tiotropium (SPIRIVA) 18 MCG inhalation capsule Place 18 mcg into inhaler and inhale daily.  Marland Kitchen glucagon (GLUCAGON EMERGENCY) 1 MG injection Inject 1 mg into the vein once as  needed.  Marland Kitchen glucose blood (CONTOUR NEXT TEST) test strip Use to test blood glucose 4 times a day. E11.65 (Patient not taking: Reported on 10/23/2017)  . TERBINAFINE HCL PO Take by mouth.   No facility-administered encounter medications on file as of 10/23/2017.    ALLERGIES: Allergies  Allergen Reactions  . Lac Bovis Nausea Only  . Plavix [Clopidogrel]   . Sucralfate    VACCINATION STATUS: Immunization History  Administered Date(s) Administered  . Influenza-Unspecified 12/28/2011  . Pneumococcal Conjugate-13 11/08/2013    Diabetes  He presents for his follow-up diabetic visit. He has type 2 diabetes mellitus. Onset time: He was diagnosed at approximate age of 36 years. His disease course has been improving. There are no hypoglycemic associated symptoms. Pertinent negatives for hypoglycemia include no confusion, headaches, pallor or seizures. There are no diabetic associated symptoms. Pertinent negatives for diabetes include no chest pain, no fatigue, no polydipsia, no polyphagia, no polyuria and no weakness. There are no hypoglycemic complications. Symptoms are improving. Diabetic complications include a CVA and peripheral neuropathy. Risk factors for coronary artery disease include diabetes mellitus, hypertension, obesity and sedentary lifestyle. His weight is decreasing steadily. He is following a generally unhealthy diet. When asked about meal planning, he reported none. He has not had a previous visit with a dietitian. He participates in exercise intermittently. His home blood glucose trend is fluctuating minimally. His breakfast blood glucose range is generally 140-180 mg/dl. His lunch blood glucose range is generally 140-180 mg/dl. His dinner blood glucose range is generally 140-180 mg/dl. His bedtime blood glucose range is generally 140-180 mg/dl. His overall blood glucose range is 140-180 mg/dl. (He wears a CGM device showing 47% time in range, 49% above range, 4% hypoglycemia.  His average  blood glucose is 189.  His previsit labs show A1c of 7.3% improving from 8.5%.) An ACE inhibitor/angiotensin II receptor blocker is being taken.  Hyperlipidemia  This is a chronic problem. The current episode started more than 1 year ago. The problem is uncontrolled. Exacerbating diseases include diabetes and hypothyroidism. Pertinent negatives include no chest pain, myalgias or shortness of breath. He is currently on no antihyperlipidemic treatment. Risk factors for coronary artery disease include diabetes mellitus, dyslipidemia, hypertension, a sedentary lifestyle, male sex and obesity.  Hypertension  This is a chronic problem. The current episode started more than 1 year ago. Pertinent negatives include no chest pain, headaches, neck pain, palpitations or shortness of breath. Risk factors for coronary artery disease include diabetes mellitus, dyslipidemia and male gender. Past treatments include angiotensin blockers. Hypertensive end-organ damage includes  CVA.     Review of Systems  Constitutional: Negative for chills, fatigue, fever and unexpected weight change.  HENT: Negative for dental problem, mouth sores and trouble swallowing.   Eyes: Negative for visual disturbance.  Respiratory: Negative for cough, choking, chest tightness, shortness of breath and wheezing.   Cardiovascular: Negative for chest pain, palpitations and leg swelling.  Gastrointestinal: Negative for abdominal distention, abdominal pain, constipation, diarrhea, nausea and vomiting.  Endocrine: Negative for polydipsia, polyphagia and polyuria.  Genitourinary: Negative for dysuria, flank pain, hematuria and urgency.  Musculoskeletal: Negative for back pain, gait problem, myalgias and neck pain.  Skin: Negative for pallor, rash and wound.  Neurological: Negative for seizures, syncope, weakness, numbness and headaches.  Psychiatric/Behavioral: Negative.  Negative for confusion and dysphoric mood.    Objective:    BP (!)  142/70   Pulse 70   Ht 6' (1.829 m)   Wt 222 lb 2 oz (100.8 kg)   SpO2 96%   BMI 30.13 kg/m   Wt Readings from Last 3 Encounters:  10/23/17 222 lb 2 oz (100.8 kg)  07/23/17 230 lb (104.3 kg)  04/25/17 223 lb (101.2 kg)    Physical Exam  Constitutional: He is oriented to person, place, and time. He appears well-developed. He is cooperative. No distress.  HENT:  Head: Normocephalic and atraumatic.  Eyes: EOM are normal.  Neck: Normal range of motion. Neck supple. No tracheal deviation present. No thyromegaly present.  Cardiovascular: Normal rate, S1 normal and S2 normal. Exam reveals no gallop.  No murmur heard. Pulses:      Dorsalis pedis pulses are 1+ on the right side, and 1+ on the left side.       Posterior tibial pulses are 1+ on the right side, and 1+ on the left side.  Pulmonary/Chest: Effort normal. No respiratory distress. He has wheezes. He has no rales.  Abdominal: He exhibits no distension. There is no tenderness. There is no guarding and no CVA tenderness.  Musculoskeletal: He exhibits no edema.       Right shoulder: He exhibits no swelling and no deformity.  Neurological: He is alert and oriented to person, place, and time. He has normal strength and normal reflexes. No cranial nerve deficit or sensory deficit. Gait normal.  Skin: Skin is warm and dry. No rash noted. No cyanosis. Nails show no clubbing.  Psychiatric: He has a normal mood and affect. His speech is normal. Judgment normal. Cognition and memory are normal.    Diabetic Labs (most recent): Lab Results  Component Value Date   HGBA1C 7.3 07/22/2017   HGBA1C 8.5 03/25/2017   HGBA1C 9.2 12/17/2016     Lipid Panel ( most recent) Lipid Panel     Component Value Date/Time   CHOL 151 12/17/2016   TRIG 124 12/17/2016   HDL 41 12/17/2016   LDLCALC 89 12/17/2016    Assessment & Plan:   1. DM type 2 causing vascular disease ([place])  - Patient has currently uncontrolled symptomatic type 2 DM since 77  years of age. - His recent labs are not reported.  His A1c from April was 7.3%, progressively improving from 9.4%  -His CGM device show average blood glucose between 140-180, with occasional blood glucose readings above 200 mg/dL.  - his diabetes is complicated by CVA and patient remains at a high risk for more acute and chronic complications of diabetes which include CAD, CVA, CKD, retinopathy, and neuropathy. These are all discussed in detail with the patient.  -  I have counseled the patient on diet management and weight loss, by adopting a carbohydrate restricted/protein rich diet.  -  Suggestion is made for him to avoid simple carbohydrates  from his diet including Cakes, Sweet Desserts / Pastries, Ice Cream, Soda (diet and regular), Sweet Tea, Candies, Chips, Cookies, Store Bought Juices, Alcohol in Excess of  1-2 drinks a day, Artificial Sweeteners, and "Sugar-free" Products. This will help patient to have stable blood glucose profile and potentially avoid unintended weight gain.  - I encouraged the patient to switch to  unprocessed or minimally processed complex starch and increased protein intake (animal or plant source), fruits, and vegetables.  - Patient is advised to stick to a routine mealtimes to eat 3 meals  a day and avoid unnecessary snacks ( to snack only to correct hypoglycemia).   - I have approached patient with the following individualized plan to manage diabetes and patient agrees:   -  He is taking prednisone 10 mg by mouth every a.m. for chronic COPD. I advised him to avoid sudden stopping of prednisone to prevent adrenal crisis.  -He will continue to require intensive treatment with basal/bolus insulin in order for him to maintain control of diabetes to target.    -The main goal of therapy in this patient would be avoiding hypoglycemia due to advanced age. -He still is not following a routine mealtime.  We had a long discussion about that and he promises to do better.    -I advised him to continue Tresiba 70 units nightly,  continue NovoLog 10 units 3 times a day before meals for premeal BG readings of 70-154m/dl, plus patient specific correction dose for unexpected hyperglycemia above 152mdl, associated with strict documenting of glucose   4 times a day-before meals and at bedtime. - Patient is warned not to take insulin without proper monitoring per orders. -Adjustment parameters are given for hypo and hyperglycemia in writing. -Patient is encouraged to call clinic for blood glucose levels less than 70 or above 300 mg /dl.  - Reportedly, he does not tolerate metformin, tried DPPIV  inhibitors before, and like to avoid those medications for now. - I have prescribed glucagon emergency kit. - His renal function is normal.  - Patient specific target  A1c;  LDL, HDL, Triglycerides, and  Waist Circumference were discussed in detail.  2) BP/HTN: His blood pressure this morning is above target at 155/72.  He claims to have whitecoat hypertension.  He is advised to continue his current blood pressure medications including losartan 100 mg p.o. daily, Lasix as needed, carvedilol 12.5 mg p.o. twice daily.    3) Lipids/HPL: His recent lipid panel showed improving LDL to 89 from 107.  He is not on statin.  He wishes to avoid any further additional medications at this time.  He will be considered for repeat fasting lipid panel on subsequent visits.    4)  Weight/Diet: CDE Consult has been  initiated , exercise, and detailed carbohydrates information provided.  5) hypothyroidism: -His previsit labs did not include thyroid function test.  I advised him to continue levothyroxine 50 mcg p.o. every morning.    - We discussed about correct intake of levothyroxine, at fasting, with water, separated by at least 30 minutes from breakfast, and separated by more than 4 hours from calcium, iron, multivitamins, acid reflux medications (PPIs). -Patient is made aware of the fact that  thyroid hormone replacement is needed for life, dose to be adjusted by periodic monitoring of thyroid function  tests.   6) Chronic Care/Health Maintenance:  -Patient is on ARB and encouraged to continue to follow up with Ophthalmology, Podiatrist at least yearly or according to recommendations, and advised to   stay away from smoking. I have recommended yearly flu vaccine and pneumonia vaccination at least every 5 years;  and  sleep for at least 7 hours a day. - I advised patient to maintain close follow up with Quentin Cornwall, MD for primary care needs.   - Time spent with the patient: 25 min, of which >50% was spent in reviewing his blood glucose logs , discussing his hypo- and hyper-glycemic episodes, reviewing his current and  previous labs and insulin doses and developing a plan to avoid hypo- and hyper-glycemia. Please refer to Patient Instructions for Blood Glucose Monitoring and Insulin/Medications Dosing Guide"  in media tab for additional information. Izell [place] Sr Megan Salon participated in the discussions, expressed understanding, and voiced agreement with the above plans.  All questions were answered to his satisfaction. he is encouraged to contact clinic should he have any questions or concerns prior to his return visit.   Follow up plan: - Return in about 3 months (around 01/23/2018) for Follow up with Pre-visit Labs, Meter, and Logs.  Glade Lloyd, MD Phone: [phone]  Fax: [phone]  -  This note was partially dictated with voice recognition software. Similar sounding words can be transcribed inadequately or may not  be corrected upon review.   10/23/2017, 12:58 PM

## 2017-11-14 ENCOUNTER — Telehealth: Payer: Self-pay | Admitting: "Endocrinology

## 2017-11-14 NOTE — Telephone Encounter (Signed)
Barry Turner is calling stating that his blood sugar is really low in the mornings when he wakes up   08/20 76 am  08/21 160  08/22 106  08/23 68  He is asking should he decrease his (TRESIBA FLEXTOUCH) 100 UNIT/ML SOPN FlexTouch Pen  Please advise?

## 2017-11-18 NOTE — Telephone Encounter (Signed)
Pt.notified

## 2017-11-18 NOTE — Telephone Encounter (Signed)
Advise to decrease tresiba to 50 units qhs.

## 2017-12-10 ENCOUNTER — Other Ambulatory Visit: Payer: Self-pay

## 2017-12-10 MED ORDER — INSULIN DEGLUDEC 100 UNIT/ML ~~LOC~~ SOPN: 70.0000 [IU] | PEN_INJECTOR | Freq: Every day | SUBCUTANEOUS | 2 refills | Status: DC

## 2018-01-26 ENCOUNTER — Other Ambulatory Visit: Payer: Self-pay

## 2018-01-26 MED ORDER — FREESTYLE LIBRE 14 DAY SENSOR MISC: 1.0000 | 2 refills | Status: DC

## 2018-01-27 ENCOUNTER — Ambulatory Visit: Payer: Medicare Other | Admitting: "Endocrinology

## 2018-01-28 ENCOUNTER — Telehealth: Payer: Self-pay | Admitting: "Endocrinology

## 2018-01-28 NOTE — Telephone Encounter (Signed)
Barry Turner is out of his Elenor Legato 14 day sensors, he states that the pharmacy states he needs an appointment with [place] first, he is scheduled on 03/03/18 soonest we have, please advise?

## 2018-02-02 NOTE — Telephone Encounter (Signed)
Pt will have to go back to using his regular glucometer until then.

## 2018-02-23 LAB — TSH: TSH: 0.63 (ref 0.41–5.90)

## 2018-02-23 LAB — HEMOGLOBIN A1C: Hemoglobin A1C: 8.3

## 2018-03-03 ENCOUNTER — Ambulatory Visit (INDEPENDENT_AMBULATORY_CARE_PROVIDER_SITE_OTHER): Payer: Medicare Other | Admitting: "Endocrinology

## 2018-03-03 ENCOUNTER — Encounter: Payer: Self-pay | Admitting: "Endocrinology

## 2018-03-03 VITALS — BP 154/77 | HR 93 | Ht 72.0 in | Wt 221.0 lb

## 2018-03-03 DIAGNOSIS — E1159 Type 2 diabetes mellitus with other circulatory complications: Secondary | ICD-10-CM

## 2018-03-03 DIAGNOSIS — I10 Essential (primary) hypertension: Secondary | ICD-10-CM | POA: Diagnosis not present

## 2018-03-03 DIAGNOSIS — E782 Mixed hyperlipidemia: Secondary | ICD-10-CM | POA: Diagnosis not present

## 2018-03-03 DIAGNOSIS — E039 Hypothyroidism, unspecified: Secondary | ICD-10-CM

## 2018-03-03 MED ORDER — INSULIN DEGLUDEC 100 UNIT/ML ~~LOC~~ SOPN: 50.0000 [IU] | PEN_INJECTOR | Freq: Every day | SUBCUTANEOUS | 2 refills | Status: DC

## 2018-03-03 MED ORDER — FREESTYLE LIBRE 14 DAY SENSOR MISC: 1.0000 | 2 refills | Status: DC

## 2018-03-03 NOTE — Progress Notes (Signed)
Endocrinology follow-up note  Subjective:    Patient ID: Barry Turner, male    DOB: 02/23/1941. Patient is being seen in follow up for management of type 2  Diabetes, hypothyroidism, hyperlipidemia, hypertension. PMD:  Milam, James T, MD  Past Medical History:  Diagnosis Date  . CHF (congestive heart failure) (HCC)   . COPD (chronic obstructive pulmonary disease) (HCC)   . Coronary artery disease   . Diabetes mellitus, type II (HCC)   . Dysthymic disorder   . Hyperlipidemia   . Hypothyroidism    Past Surgical History:  Procedure Laterality Date  . CHOLECYSTECTOMY    . CORONARY ARTERY BYPASS GRAFT    . MEDIASTINOSCOPY     Social History   Socioeconomic History  . Marital status: Married    Spouse name: Not on file  . Number of children: Not on file  . Years of education: Not on file  . Highest education level: Not on file  Occupational History  . Not on file  Social Needs  . Financial resource strain: Not on file  . Food insecurity:    Worry: Not on file    Inability: Not on file  . Transportation needs:    Medical: Not on file    Non-medical: Not on file  Tobacco Use  . Smoking status: Never Smoker  . Smokeless tobacco: Never Used  Substance and Sexual Activity  . Alcohol use: No  . Drug use: No  . Sexual activity: Not on file  Lifestyle  . Physical activity:    Days per week: Not on file    Minutes per session: Not on file  . Stress: Not on file  Relationships  . Social connections:    Talks on phone: Not on file    Gets together: Not on file    Attends religious service: Not on file    Active member of club or organization: Not on file    Attends meetings of clubs or organizations: Not on file    Relationship status: Not on file  Other Topics Concern  . Not on file  Social History Narrative  . Not on file   Outpatient Encounter Medications as of 03/03/2018  Medication Sig  . apixaban (ELIQUIS) 5 MG TABS tablet Take 5 mg by mouth 2 (two)  times daily.  . aspirin EC 81 MG tablet Take 81 mg by mouth daily.  . carvedilol (COREG) 12.5 MG tablet Take 12.5 mg by mouth 2 (two) times daily with a meal.  . Continuous Blood Gluc Sensor (FREESTYLE LIBRE 14 DAY SENSOR) MISC 1 each by Does not apply route every 14 (fourteen) days.  . Continuous Blood Gluc Sensor (FREESTYLE LIBRE SENSOR SYSTEM) MISC Use one sensor every 10 days.  . esomeprazole (NEXIUM) 40 MG capsule Take 40 mg by mouth daily at 12 noon.  . Fluticasone-Salmeterol (ADVAIR) 250-50 MCG/DOSE AEPB Inhale 1 puff into the lungs 2 (two) times daily as needed.  . furosemide (LASIX) 20 MG tablet Take 20 mg by mouth 2 (two) times daily.  . glucagon (GLUCAGON EMERGENCY) 1 MG injection Inject 1 mg into the vein once as needed.  . glucose blood (CONTOUR NEXT TEST) test strip Use to test blood glucose 4 times a day. E11.65 (Patient not taking: Reported on 10/23/2017)  . ibuprofen (ADVIL,MOTRIN) 800 MG tablet Take 800 mg by mouth every 8 (eight) hours as needed.  . insulin aspart (NOVOLOG FLEXPEN) 100 UNIT/ML FlexPen Inject 5-11 Units into the skin 3 (three) times   daily with meals.  . insulin degludec (TRESIBA FLEXTOUCH) 100 UNIT/ML SOPN FlexTouch Pen Inject 0.5 mLs (50 Units total) into the skin at bedtime.  . ipratropium-albuterol (DUONEB) 0.5-2.5 (3) MG/3ML SOLN Take 3 mLs by nebulization as directed.  . isosorbide dinitrate (ISORDIL) 30 MG tablet Take 30 mg by mouth daily.  . levothyroxine (SYNTHROID, LEVOTHROID) 50 MCG tablet Take 50 mcg by mouth daily before breakfast.  . LORazepam (ATIVAN) 0.5 MG tablet Take 0.5 mg by mouth at bedtime.  . losartan (COZAAR) 100 MG tablet Take 100 mg by mouth daily.  . meclizine (ANTIVERT) 25 MG tablet Take 25 mg by mouth 2 (two) times daily as needed for dizziness.  . MICROLET LANCETS MISC Use 4 times a day to test blood glucose.  . nitroGLYCERIN (NITROSTAT) 0.4 MG SL tablet Place 0.4 mg under the tongue every 5 (five) minutes as needed for chest pain.  .  potassium chloride (K-DUR) 10 MEQ tablet Take 10 mEq by mouth daily.  . predniSONE (DELTASONE) 10 MG tablet Take 10 mg by mouth daily with breakfast.  . ranitidine (ZANTAC) 150 MG capsule Take 150 mg by mouth 2 (two) times daily.  . TERBINAFINE HCL PO Take by mouth.  . tiotropium (SPIRIVA) 18 MCG inhalation capsule Place 18 mcg into inhaler and inhale daily.  . [DISCONTINUED] Continuous Blood Gluc Sensor (FREESTYLE LIBRE 14 DAY SENSOR) MISC 1 each by Does not apply route every 14 (fourteen) days.  . [DISCONTINUED] insulin degludec (TRESIBA FLEXTOUCH) 100 UNIT/ML SOPN FlexTouch Pen Inject 0.7 mLs (70 Units total) into the skin at bedtime. (Patient taking differently: Inject 50 Units into the skin at bedtime. )   No facility-administered encounter medications on file as of 03/03/2018.    ALLERGIES: Allergies  Allergen Reactions  . Lac Bovis Nausea Only  . Plavix [Clopidogrel]   . Sucralfate    VACCINATION STATUS: Immunization History  Administered Date(s) Administered  . Influenza-Unspecified 12/28/2011  . Pneumococcal Conjugate-13 11/08/2013    Diabetes  He presents for his follow-up diabetic visit. He has type 2 diabetes mellitus. Onset time: He was diagnosed at approximate age of 41 years. His disease course has been worsening. There are no hypoglycemic associated symptoms. Pertinent negatives for hypoglycemia include no confusion, headaches, pallor or seizures. There are no diabetic associated symptoms. Pertinent negatives for diabetes include no chest pain, no fatigue, no polydipsia, no polyphagia, no polyuria and no weakness. There are no hypoglycemic complications. Symptoms are worsening. Diabetic complications include a CVA and peripheral neuropathy. Risk factors for coronary artery disease include diabetes mellitus, hypertension, obesity and sedentary lifestyle. His weight is fluctuating minimally. He is following a generally unhealthy diet. When asked about meal planning, he reported  none. He has not had a previous visit with a dietitian. He participates in exercise intermittently. His home blood glucose trend is fluctuating dramatically. His breakfast blood glucose range is generally 180-200 mg/dl. His lunch blood glucose range is generally 140-180 mg/dl. His dinner blood glucose range is generally 180-200 mg/dl. His bedtime blood glucose range is generally 140-180 mg/dl. His overall blood glucose range is 180-200 mg/dl. (He wears a CGM device showing 47% time in range, 49% above range, 4% hypoglycemia.  His average blood glucose is 189.  His previsit labs show A1c of 7.3% improving from 8.5%.) An ACE inhibitor/angiotensin II receptor blocker is being taken.  Hyperlipidemia  This is a chronic problem. The current episode started more than 1 year ago. The problem is uncontrolled. Exacerbating diseases include diabetes and hypothyroidism.   Pertinent negatives include no chest pain, myalgias or shortness of breath. He is currently on no antihyperlipidemic treatment. Risk factors for coronary artery disease include diabetes mellitus, dyslipidemia, hypertension, a sedentary lifestyle, male sex and obesity.  Hypertension  This is a chronic problem. The current episode started more than 1 year ago. Pertinent negatives include no chest pain, headaches, neck pain, palpitations or shortness of breath. Risk factors for coronary artery disease include diabetes mellitus, dyslipidemia and male gender. Past treatments include angiotensin blockers. Hypertensive end-organ damage includes CVA.     Review of Systems  Constitutional: Negative for chills, fatigue, fever and unexpected weight change.  HENT: Negative for dental problem, mouth sores and trouble swallowing.   Eyes: Negative for visual disturbance.  Respiratory: Negative for cough, choking, chest tightness, shortness of breath and wheezing.   Cardiovascular: Negative for chest pain, palpitations and leg swelling.  Gastrointestinal: Negative  for abdominal distention, abdominal pain, constipation, diarrhea, nausea and vomiting.  Endocrine: Negative for polydipsia, polyphagia and polyuria.  Genitourinary: Negative for dysuria, flank pain, hematuria and urgency.  Musculoskeletal: Negative for back pain, gait problem, myalgias and neck pain.  Skin: Negative for pallor, rash and wound.  Neurological: Negative for seizures, syncope, weakness, numbness and headaches.  Psychiatric/Behavioral: Negative.  Negative for confusion and dysphoric mood.    Objective:    BP (!) 154/77   Pulse 93   Ht 6' (1.829 m)   Wt 221 lb (100.2 kg)   BMI 29.97 kg/m   Wt Readings from Last 3 Encounters:  03/03/18 221 lb (100.2 kg)  10/23/17 222 lb 2 oz (100.8 kg)  07/23/17 230 lb (104.3 kg)    Physical Exam  Constitutional: He is oriented to person, place, and time. He appears well-developed. He is cooperative. No distress.  HENT:  Head: Normocephalic and atraumatic.  Eyes: EOM are normal.  Neck: Normal range of motion. Neck supple. No tracheal deviation present. No thyromegaly present.  Cardiovascular: Normal rate, S1 normal and S2 normal. Exam reveals no gallop.  No murmur heard. Pulses:      Dorsalis pedis pulses are 1+ on the right side, and 1+ on the left side.       Posterior tibial pulses are 1+ on the right side, and 1+ on the left side.  Pulmonary/Chest: Effort normal. No respiratory distress. He has wheezes. He has no rales.  Abdominal: He exhibits no distension. There is no tenderness. There is no guarding and no CVA tenderness.  Musculoskeletal: He exhibits no edema.       Right shoulder: He exhibits no swelling and no deformity.  Neurological: He is alert and oriented to person, place, and time. He has normal strength and normal reflexes. No cranial nerve deficit or sensory deficit. Gait normal.  Skin: Skin is warm and dry. No rash noted. No cyanosis. Nails show no clubbing.  Psychiatric: He has a normal mood and affect. His speech  is normal. Judgment normal. Cognition and memory are normal.    Diabetic Labs (most recent): Lab Results  Component Value Date   HGBA1C 7.3 07/22/2017   HGBA1C 8.5 03/25/2017   HGBA1C 9.2 12/17/2016     Lipid Panel ( most recent) Lipid Panel     Component Value Date/Time   CHOL 151 12/17/2016   TRIG 124 12/17/2016   HDL 41 12/17/2016   LDLCALC 89 12/17/2016    Assessment & Plan:   1. DM type 2 causing vascular disease ([place])  - Patient has currently uncontrolled symptomatic type 2  DM since 77 years of age. - He returns with higher A1c of 8.3%.  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   -He did not tolerate Tresiba 70 units, lowered to 50 units in the interim. -He did not use his CGM device in the last 2 weeks due to noncoverage by his insurance.  - his diabetes is complicated by CVA and patient remains at a high risk for more acute and chronic complications of diabetes which include CAD, CVA, CKD, retinopathy, and neuropathy. These are all discussed in detail with the patient.  - I have counseled the patient on diet management and weight loss, by adopting a carbohydrate restricted/protein rich diet.  -  Suggestion is made for him to avoid simple carbohydrates  from his diet including Cakes, Sweet Desserts / Pastries, Ice Cream, Soda (diet and regular), Sweet Tea, Candies, Chips, Cookies, Store Bought Juices, Alcohol in Excess of  1-2 drinks a day, Artificial Sweeteners, and "Sugar-free" Products. This will help patient to have stable blood glucose profile and potentially avoid unintended weight gain.   - I encouraged the patient to switch to  unprocessed or minimally processed complex starch and increased protein intake (animal or plant source), fruits, and vegetables.  - Patient is advised to stick to a routine mealtimes to eat 3 meals  a day and avoid unnecessary snacks ( to snack only to correct  hypoglycemia).   - I have approached patient with the following individualized plan to manage diabetes and patient agrees:   -  He is taking prednisone 10 mg by mouth every a.m. for chronic COPD.  I advised him to avoid sudden stopping of prednisone to prevent adrenal crisis.  -He will continue to require intensive treatment with basal/bolus insulin in order for him to maintain control of diabetes to target.     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 -He has benefited tremendously from his CGM device, he will continue to need it to optimize his diabetes care.  -He is advised to continue on a lower dose of Tresiba at 50 units nightly, lower NovoLog to 5 units 3 times a day before meals for premeal BG readings of 70-153m/dl, plus patient specific correction dose for unexpected hyperglycemia above 1554mdl, associated with strict documenting of glucose   4 times a day-before meals and at bedtime. - Patient is warned not to take insulin without proper monitoring per orders. -Adjustment parameters are given for hypo and hyperglycemia in writing. -Patient is encouraged to call clinic for blood glucose levels less than 70 or above 300 mg /dl.  - Reportedly, he does not tolerate metformin, tried DPPIV  inhibitors before, and like to avoid those medications for now. - I have prescribed glucagon emergency kit. - His renal function is normal.  - Patient specific target  A1c;  LDL, HDL, Triglycerides, and  Waist Circumference were discussed in detail.  2) BP/HTN: His blood pressure this morning is above target at 154/77.   He claims to have whitecoat hypertension.  He is advised to continue his current blood pressure medications including losartan 100 mg p.o. daily, Lasix as needed, carvedilol 12.5 mg p.o. twice daily.    3) Lipids/HPL: His recent lipid panel showed improving LDL to  89 from 107.  He is not on statin.  He wishes to avoid any further additional  medications at this time.  He will be considered for repeat fasting lipid panel on subsequent visits.    4)  Weight/Diet: CDE Consult has been  initiated , exercise, and detailed carbohydrates information provided.  5) hypothyroidism: -His previsit labs for thyroid function tests are consistent with appropriate replacement.  He is advised to continue levothyroxine 50 mcg p.o. every morning.    - We discussed about correct intake of levothyroxine, at fasting, with water, separated by at least 30 minutes from breakfast, and separated by more than 4 hours from calcium, iron, multivitamins, acid reflux medications (PPIs). -Patient is made aware of the fact that thyroid hormone replacement is needed for life, dose to be adjusted by periodic monitoring of thyroid function tests.   6) Chronic Care/Health Maintenance:  -Patient is on ARB and encouraged to continue to follow up with Ophthalmology, Podiatrist at least yearly or according to recommendations, and advised to   stay away from smoking. I have recommended yearly flu vaccine and pneumonia vaccination at least every 5 years;  and  sleep for at least 7 hours a day. - I advised patient to maintain close follow up with Milam, James T, MD for primary care needs.  - Time spent with the patient: 25 min, of which >50% was spent in reviewing his blood glucose logs , discussing his hypo- and hyper-glycemic episodes, reviewing his current and  previous labs and insulin doses and developing a plan to avoid hypo- and hyper-glycemia. Please refer to Patient Instructions for Blood Glucose Monitoring and Insulin/Medications Dosing Guide"  in media tab for additional information. Barry Turner participated in the discussions, expressed understanding, and voiced agreement with the above plans.  All questions were answered to his satisfaction. he is encouraged to contact clinic  should he have any questions or concerns prior to his return visit.   Follow up plan: - Return in about 3 months (around 06/02/2018) for Follow up with Pre-visit Labs, Meter, and Logs.  Gebre Nida, MD Phone: [phone]  Fax: [phone]  -  This note was partially dictated with voice recognition software. Similar sounding words can be transcribed inadequately or may not  be corrected upon review.   03/03/2018, 12:11 PM 

## 2018-03-03 NOTE — Patient Instructions (Signed)

## 2018-05-25 LAB — TSH: TSH: 0.61 (ref 0.41–5.90)

## 2018-05-25 LAB — BASIC METABOLIC PANEL
BUN: 23 — AB (ref 4–21)
Creatinine: 1.2 (ref 0.6–1.3)

## 2018-05-25 LAB — HEMOGLOBIN A1C: Hemoglobin A1C: 9.1

## 2018-06-03 ENCOUNTER — Ambulatory Visit: Payer: Medicare Other | Admitting: "Endocrinology

## 2018-06-03 ENCOUNTER — Encounter: Payer: Self-pay | Admitting: "Endocrinology

## 2018-06-03 ENCOUNTER — Other Ambulatory Visit: Payer: Self-pay

## 2018-06-03 VITALS — BP 132/76 | HR 61 | Ht 72.0 in | Wt 212.0 lb

## 2018-06-03 DIAGNOSIS — I10 Essential (primary) hypertension: Secondary | ICD-10-CM | POA: Diagnosis not present

## 2018-06-03 DIAGNOSIS — E1159 Type 2 diabetes mellitus with other circulatory complications: Secondary | ICD-10-CM | POA: Diagnosis not present

## 2018-06-03 DIAGNOSIS — E782 Mixed hyperlipidemia: Secondary | ICD-10-CM

## 2018-06-03 DIAGNOSIS — E039 Hypothyroidism, unspecified: Secondary | ICD-10-CM | POA: Diagnosis not present

## 2018-06-03 MED ORDER — INSULIN PEN NEEDLE 31G X 8 MM MISC: 1.0000 | 3 refills | Status: DC

## 2018-06-03 MED ORDER — INSULIN ASPART 100 UNIT/ML FLEXPEN: 5.0000 [IU] | PEN_INJECTOR | Freq: Three times a day (TID) | SUBCUTANEOUS | 2 refills | Status: DC

## 2018-06-03 MED ORDER — MICROLET LANCETS MISC: 3 refills | Status: AC

## 2018-06-03 MED ORDER — INSULIN DEGLUDEC 100 UNIT/ML ~~LOC~~ SOPN: 54.0000 [IU] | PEN_INJECTOR | Freq: Every day | SUBCUTANEOUS | 2 refills | Status: DC

## 2018-06-03 NOTE — Progress Notes (Signed)
Endocrinology follow-up note  Subjective:    Patient ID: Barry Turner, male    DOB: 03/24/1941. Patient is being seen in follow up for management of type 2  Diabetes, hypothyroidism, hyperlipidemia, hypertension. PMD:  Quentin Cornwall, MD  Past Medical History:  Diagnosis Date  . CHF (congestive heart failure) ([place])   . COPD (chronic obstructive pulmonary disease) ([place])   . Coronary artery disease   . Diabetes mellitus, type II ([place])   . Dysthymic disorder   . Hyperlipidemia   . Hypothyroidism    Past Surgical History:  Procedure Laterality Date  . CHOLECYSTECTOMY    . CORONARY ARTERY BYPASS GRAFT    . MEDIASTINOSCOPY     Social History   Socioeconomic History  . Marital status: Married    Spouse name: Not on file  . Number of children: Not on file  . Years of education: Not on file  . Highest education level: Not on file  Occupational History  . Not on file  Social Needs  . Financial resource strain: Not on file  . Food insecurity:    Worry: Not on file    Inability: Not on file  . Transportation needs:    Medical: Not on file    Non-medical: Not on file  Tobacco Use  . Smoking status: Never Smoker  . Smokeless tobacco: Never Used  Substance and Sexual Activity  . Alcohol use: No  . Drug use: No  . Sexual activity: Not on file  Lifestyle  . Physical activity:    Days per week: Not on file    Minutes per session: Not on file  . Stress: Not on file  Relationships  . Social connections:    Talks on phone: Not on file    Gets together: Not on file    Attends religious service: Not on file    Active member of club or organization: Not on file    Attends meetings of clubs or organizations: Not on file    Relationship status: Not on file  Other Topics Concern  . Not on file  Social History Narrative  . Not on file   Outpatient Encounter Medications as of 06/03/2018  Medication Sig  . sotalol (BETAPACE) 80 MG tablet Take 80 mg by mouth 2 (two)  times daily.  Marland Kitchen apixaban (ELIQUIS) 5 MG TABS tablet Take 5 mg by mouth 2 (two) times daily.  Marland Kitchen aspirin EC 81 MG tablet Take 81 mg by mouth daily.  . carvedilol (COREG) 12.5 MG tablet Take 12.5 mg by mouth 2 (two) times daily with a meal.  . Continuous Blood Gluc Sensor (FREESTYLE LIBRE 14 DAY SENSOR) MISC 1 each by Does not apply route every 14 (fourteen) days.  . Continuous Blood Gluc Sensor (FREESTYLE LIBRE SENSOR SYSTEM) MISC Use one sensor every 10 days.  Marland Kitchen esomeprazole (NEXIUM) 40 MG capsule Take 40 mg by mouth daily at 12 noon.  . Fluticasone-Salmeterol (ADVAIR) 250-50 MCG/DOSE AEPB Inhale 1 puff into the lungs 2 (two) times daily as needed.  . furosemide (LASIX) 20 MG tablet Take 20 mg by mouth 2 (two) times daily.  Marland Kitchen glucagon (GLUCAGON EMERGENCY) 1 MG injection Inject 1 mg into the vein once as needed.  Marland Kitchen glucose blood (CONTOUR NEXT TEST) test strip Use to test blood glucose 4 times a day. E11.65 (Patient not taking: Reported on 10/23/2017)  . ibuprofen (ADVIL,MOTRIN) 800 MG tablet Take 800 mg by mouth every 8 (eight) hours as needed.  Marland Kitchen  insulin aspart (NOVOLOG FLEXPEN) 100 UNIT/ML FlexPen Inject 5-11 Units into the skin 3 (three) times daily with meals.  . insulin degludec (TRESIBA FLEXTOUCH) 100 UNIT/ML SOPN FlexTouch Pen Inject 0.54 mLs (54 Units total) into the skin at bedtime.  . Insulin Pen Needle (B-D ULTRAFINE III SHORT PEN) 31G X 8 MM MISC 1 each by Does not apply route as directed.  Marland Kitchen ipratropium-albuterol (DUONEB) 0.5-2.5 (3) MG/3ML SOLN Take 3 mLs by nebulization as directed.  . isosorbide dinitrate (ISORDIL) 30 MG tablet Take 30 mg by mouth daily.  Marland Kitchen levothyroxine (SYNTHROID, LEVOTHROID) 50 MCG tablet Take 50 mcg by mouth daily before breakfast.  . LORazepam (ATIVAN) 0.5 MG tablet Take 0.5 mg by mouth at bedtime.  Marland Kitchen losartan (COZAAR) 100 MG tablet Take 100 mg by mouth daily.  . meclizine (ANTIVERT) 25 MG tablet Take 25 mg by mouth 2 (two) times daily as needed for dizziness.   . Microlet Lancets MISC Use 4 times a day to test blood glucose.  . nitroGLYCERIN (NITROSTAT) 0.4 MG SL tablet Place 0.4 mg under the tongue every 5 (five) minutes as needed for chest pain.  . potassium chloride (K-DUR) 10 MEQ tablet Take 10 mEq by mouth daily.  . predniSONE (DELTASONE) 10 MG tablet Take 10 mg by mouth daily with breakfast.  . ranitidine (ZANTAC) 150 MG capsule Take 150 mg by mouth 2 (two) times daily.  . TERBINAFINE HCL PO Take by mouth.  . tiotropium (SPIRIVA) 18 MCG inhalation capsule Place 18 mcg into inhaler and inhale daily.  . [DISCONTINUED] insulin aspart (NOVOLOG FLEXPEN) 100 UNIT/ML FlexPen Inject 5-11 Units into the skin 3 (three) times daily with meals.  . [DISCONTINUED] insulin degludec (TRESIBA FLEXTOUCH) 100 UNIT/ML SOPN FlexTouch Pen Inject 0.5 mLs (50 Units total) into the skin at bedtime.  . [DISCONTINUED] MICROLET LANCETS MISC Use 4 times a day to test blood glucose.   No facility-administered encounter medications on file as of 06/03/2018.    ALLERGIES: Allergies  Allergen Reactions  . [place] Nausea Only  . Plavix [Clopidogrel]   . Sucralfate    VACCINATION STATUS: Immunization History  Administered Date(s) Administered  . Influenza-Unspecified 12/28/2011  . Pneumococcal Conjugate-13 11/08/2013    Diabetes  He presents for his follow-up diabetic visit. He has type 2 diabetes mellitus. Onset time: He was diagnosed at approximate age of 63 years. His disease course has been worsening. There are no hypoglycemic associated symptoms. Pertinent negatives for hypoglycemia include no confusion, headaches, pallor or seizures. There are no diabetic associated symptoms. Pertinent negatives for diabetes include no chest pain, no fatigue, no polydipsia, no polyphagia, no polyuria and no weakness. There are no hypoglycemic complications. Symptoms are worsening. Diabetic complications include a CVA and peripheral neuropathy. Risk factors for coronary artery  disease include diabetes mellitus, hypertension, obesity and sedentary lifestyle. His weight is decreasing steadily. He is following a generally unhealthy diet. When asked about meal planning, he reported none. He has not had a previous visit with a dietitian. He participates in exercise intermittently. His home blood glucose trend is fluctuating dramatically. His breakfast blood glucose range is generally >200 mg/dl. His lunch blood glucose range is generally 180-200 mg/dl. His dinner blood glucose range is generally 180-200 mg/dl. His bedtime blood glucose range is generally >200 mg/dl. His overall blood glucose range is >200 mg/dl. (He has had difficulty getting his CGM device supplies from his former dispenser, currently trying with a different company.  He has not used it in the last  month, returning with A1c higher at 9.1% increasing from 8.3%.   ) An ACE inhibitor/angiotensin II receptor blocker is being taken.  Hyperlipidemia  This is a chronic problem. The current episode started more than 1 year ago. The problem is uncontrolled. Exacerbating diseases include diabetes and hypothyroidism. Pertinent negatives include no chest pain, myalgias or shortness of breath. He is currently on no antihyperlipidemic treatment. Risk factors for coronary artery disease include diabetes mellitus, dyslipidemia, hypertension, a sedentary lifestyle, male sex and obesity.  Hypertension  This is a chronic problem. The current episode started more than 1 year ago. Pertinent negatives include no chest pain, headaches, neck pain, palpitations or shortness of breath. Risk factors for coronary artery disease include diabetes mellitus, dyslipidemia and male gender. Past treatments include angiotensin blockers. Hypertensive end-organ damage includes CVA.     Review of Systems  Constitutional: Negative for chills, fatigue, fever and unexpected weight change.  HENT: Negative for dental problem, mouth sores and trouble  swallowing.   Eyes: Negative for visual disturbance.  Respiratory: Negative for cough, choking, chest tightness, shortness of breath and wheezing.   Cardiovascular: Negative for chest pain, palpitations and leg swelling.  Gastrointestinal: Negative for abdominal distention, abdominal pain, constipation, diarrhea, nausea and vomiting.  Endocrine: Negative for polydipsia, polyphagia and polyuria.  Genitourinary: Negative for dysuria, flank pain, hematuria and urgency.  Musculoskeletal: Negative for back pain, gait problem, myalgias and neck pain.  Skin: Negative for pallor, rash and wound.  Neurological: Negative for seizures, syncope, weakness, numbness and headaches.  Psychiatric/Behavioral: Negative.  Negative for confusion and dysphoric mood.    Objective:    BP 132/76   Pulse 61   Ht 6' (1.829 m)   Wt 212 lb (96.2 kg)   BMI 28.75 kg/m   Wt Readings from Last 3 Encounters:  06/03/18 212 lb (96.2 kg)  03/03/18 221 lb (100.2 kg)  10/23/17 222 lb 2 oz (100.8 kg)    Physical Exam  Constitutional: He is oriented to person, place, and time. He appears well-developed. He is cooperative. No distress.  HENT:  Head: Normocephalic and atraumatic.  Eyes: EOM are normal.  Neck: Normal range of motion. Neck supple. No tracheal deviation present. No thyromegaly present.  Cardiovascular: Normal rate, S1 normal and S2 normal. Exam reveals no gallop.  No murmur heard. Pulses:      Dorsalis pedis pulses are 1+ on the right side and 1+ on the left side.       Posterior tibial pulses are 1+ on the right side and 1+ on the left side.  Pulmonary/Chest: Effort normal. No respiratory distress. He has wheezes. He has no rales.  Abdominal: He exhibits no distension. There is no abdominal tenderness. There is no guarding and no CVA tenderness.  Musculoskeletal:        General: No edema.     Right shoulder: He exhibits no swelling and no deformity.  Neurological: He is alert and oriented to person,  place, and time. He has normal strength and normal reflexes. No cranial nerve deficit or sensory deficit. Gait normal.  Skin: Skin is warm and dry. No rash noted. No cyanosis. Nails show no clubbing.  Psychiatric: He has a normal mood and affect. His speech is normal. Judgment normal. Cognition and memory are normal.    Diabetic Labs (most recent): Lab Results  Component Value Date   HGBA1C 9.1 05/25/2018   HGBA1C 8.3 02/23/2018   HGBA1C 7.3 07/22/2017     Lipid Panel ( most recent) Lipid  Panel     Component Value Date/Time   CHOL 151 12/17/2016   TRIG 124 12/17/2016   HDL 41 12/17/2016   LDLCALC 89 12/17/2016    Assessment & Plan:   1. DM type 2 causing vascular disease ([place])  - Patient has currently uncontrolled symptomatic type 2 DM since 78 years of age. - He returns with higher A1c of 9.1%, increasing from 8.3%.   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   -He has had difficulty getting supplies for his CGM, trying with a different company this time.     - his diabetes is complicated by CVA and patient remains at a high risk for more acute and chronic complications of diabetes which include CAD, CVA, CKD, retinopathy, and neuropathy. These are all discussed in detail with the patient.  - I have counseled the patient on diet management and weight loss, by adopting a carbohydrate restricted/protein rich diet. - Patient admits there is a room for improvement in his diet and drink choices. -  Suggestion is made for him to avoid simple carbohydrates  from his diet including Cakes, Sweet Desserts / Pastries, Ice Cream, Soda (diet and regular), Sweet Tea, Candies, Chips, Cookies, Store Bought Juices, Alcohol in Excess of  1-2 drinks a day, Artificial Sweeteners, and "Sugar-free" Products. This will help patient to have stable blood glucose profile and potentially avoid unintended weight gain.   - I  encouraged the patient to switch to  unprocessed or minimally processed complex starch and increased protein intake (animal or plant source), fruits, and vegetables.  - Patient is advised to stick to a routine mealtimes to eat 3 meals  a day and avoid unnecessary snacks ( to snack only to correct hypoglycemia).   - I have approached patient with the following individualized plan to manage diabetes and patient agrees:   -  He is taking prednisone 10 mg by mouth every a.m. for chronic COPD.  I advised him to avoid sudden stopping of prednisone to prevent adrenal crisis.  -This patient will continue to require intensive treatment with basal/bolus insulin in order for him to achieve and maintain control of diabetes to target.    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 -He has had benefited tremendously from his CGM device until he could not get any longer from advanced diabetes supplies from [place], he is encouraged to continue to work with his new dispenser.    -He is advised to increase [place] slightly to 54 units nightly, continue  NovoLog  5 units 3 times a day before meals for premeal BG readings of 70-189m/dl, plus patient specific correction dose for unexpected hyperglycemia above 1510mdl, associated with strict documenting of glucose   4 times a day-before meals and at bedtime. - Patient is warned not to take insulin without proper monitoring per orders. -Adjustment parameters are given for hypo and hyperglycemia in writing. -Patient is encouraged to call clinic for blood glucose levels less than 70 or above 300 mg /dl.  - Reportedly, he does not tolerate metformin, tried DPPIV  inhibitors before, and like to avoid those medications for now. - I have prescribed glucagon emergency kit. - His renal function continues to be normal.   - Patient specific target  A1c;  LDL,  HDL, Triglycerides, and  Waist Circumference were discussed in detail.  2) BP/HTN: His blood pressure is controlled to target.  He is advised to continue his current blood pressure medications including losartan 100 mg p.o. daily, Lasix as needed, carvedilol 12.5 mg p.o. twice daily.    3) Lipids/HPL: His recent lipid panel showed improving LDL to 89 from 107.  He is not on statin.  He wishes to avoid any further additional medications at this time.  He will be considered for repeat fasting lipid panel on subsequent visits.    4)  Weight/Diet: CDE Consult has been  initiated , exercise, and detailed carbohydrates information provided.  5) hypothyroidism: -His previsit labs for thyroid function tests are consistent with appropriate replacement.  He is advised to continue levothyroxine 50 mcg p.o. every morning.    - We discussed about correct intake of levothyroxine, at fasting, with water, separated by at least 30 minutes from breakfast, and separated by more than 4 hours from calcium, iron, multivitamins, acid reflux medications (PPIs). -Patient is made aware of the fact that thyroid hormone replacement is needed for life, dose to be adjusted by periodic monitoring of thyroid function tests.   6) Chronic Care/Health Maintenance:  -Patient is on ARB and encouraged to continue to follow up with Ophthalmology, Podiatrist at least yearly or according to recommendations, and advised to   stay away from smoking. I have recommended yearly flu vaccine and pneumonia vaccination at least every 5 years;  and  sleep for at least 7 hours a day. - I advised patient to maintain close follow up with Quentin Cornwall, MD for primary care needs.  - Time spent with the patient: 25 min, of which >50% was spent in reviewing his blood glucose logs , discussing his hypoglycemia and hyperglycemia episodes, reviewing his current and  previous labs / studies and medications  doses and developing a plan to avoid  hypoglycemia and hyperglycemia. Please refer to Patient Instructions for Blood Glucose Monitoring and Insulin/Medications Dosing Guide"  in media tab for additional information. Please  also refer to " Patient Self Inventory" in the Media  tab for reviewed elements of pertinent patient history.  Barry Turner Megan Salon participated in the discussions, expressed understanding, and voiced agreement with the above plans.  All questions were answered to his satisfaction. he is encouraged to contact clinic should he have any questions or concerns prior to his return visit.  Follow up plan: - Return in about 3 months (around 09/03/2018) for Follow up with Pre-visit Labs, Meter, and Logs.  Glade Lloyd, MD Phone: [phone]  Fax: [phone]  -  This note was partially dictated with voice recognition software. Similar sounding words can be transcribed inadequately or may not  be corrected upon review.   06/03/2018, 11:28 AM

## 2018-06-03 NOTE — Progress Notes (Signed)
Pt has been using the 14 day libre until [facility] has refused to send patient sensors approx. 1 month ago.

## 2018-06-03 NOTE — Patient Instructions (Signed)

## 2018-06-08 ENCOUNTER — Other Ambulatory Visit: Payer: Self-pay

## 2018-06-08 ENCOUNTER — Telehealth: Payer: Self-pay | Admitting: "Endocrinology

## 2018-06-08 MED ORDER — FREESTYLE LIBRE 14 DAY SENSOR MISC: 1.0000 | 2 refills | Status: DC

## 2018-06-08 NOTE — Telephone Encounter (Signed)
Spoke w [place] to get Mr [employer]. They are only waiting for Mr Clason to call them back and pay his copay.

## 2018-06-09 ENCOUNTER — Other Ambulatory Visit: Payer: Self-pay

## 2018-06-09 ENCOUNTER — Encounter: Payer: Self-pay | Admitting: "Endocrinology

## 2018-06-09 MED ORDER — FREESTYLE LIBRE 14 DAY SENSOR MISC: 1.0000 | 2 refills | Status: DC

## 2018-06-09 NOTE — Telephone Encounter (Signed)
Pt has called multiple times stating that we are note sending in notes to [facility] so he can get his supplies. We have sent this several times. Called [employer] yesterday. Spoke with Elberta Fortis. They state that they have everything they need from [place] however they have been trying to get [name] on the phone to pay his deductible so they can ship the [place].

## 2018-06-09 NOTE — Progress Notes (Signed)
Pt is adhering to CGM libre. Pt is testing BG 4 x daily. Takes insulin 4 x daily. Pt is on a sliding scale.

## 2018-09-03 ENCOUNTER — Encounter: Payer: Self-pay | Admitting: "Endocrinology

## 2018-09-03 ENCOUNTER — Encounter (INDEPENDENT_AMBULATORY_CARE_PROVIDER_SITE_OTHER): Payer: Self-pay

## 2018-09-03 ENCOUNTER — Ambulatory Visit (INDEPENDENT_AMBULATORY_CARE_PROVIDER_SITE_OTHER): Payer: Medicare Other | Admitting: "Endocrinology

## 2018-09-03 ENCOUNTER — Other Ambulatory Visit: Payer: Self-pay

## 2018-09-03 VITALS — BP 150/69 | HR 58 | Ht 72.0 in | Wt 214.0 lb

## 2018-09-03 DIAGNOSIS — E782 Mixed hyperlipidemia: Secondary | ICD-10-CM | POA: Diagnosis not present

## 2018-09-03 DIAGNOSIS — I10 Essential (primary) hypertension: Secondary | ICD-10-CM

## 2018-09-03 DIAGNOSIS — E039 Hypothyroidism, unspecified: Secondary | ICD-10-CM | POA: Diagnosis not present

## 2018-09-03 DIAGNOSIS — E1159 Type 2 diabetes mellitus with other circulatory complications: Secondary | ICD-10-CM | POA: Diagnosis not present

## 2018-09-03 MED ORDER — TRESIBA FLEXTOUCH 100 UNIT/ML ~~LOC~~ SOPN: 56.0000 [IU] | PEN_INJECTOR | Freq: Every day | SUBCUTANEOUS | 2 refills | Status: DC

## 2018-09-03 NOTE — Progress Notes (Signed)
Endocrinology follow-up note  Subjective:    Patient ID: Barry Turner, male    DOB: 1940/08/19. Patient is being seen in follow up for management of type 2  Diabetes, hypothyroidism, hyperlipidemia, hypertension. PMD:  Quentin Cornwall, MD  Past Medical History:  Diagnosis Date  . CHF (congestive heart failure) ([place])   . COPD (chronic obstructive pulmonary disease) ([place])   . Coronary artery disease   . Diabetes mellitus, type II ([place])   . Dysthymic disorder   . Hyperlipidemia   . Hypothyroidism    Past Surgical History:  Procedure Laterality Date  . CHOLECYSTECTOMY    . CORONARY ARTERY BYPASS GRAFT    . MEDIASTINOSCOPY     Social History   Socioeconomic History  . Marital status: Married    Spouse name: Not on file  . Number of children: Not on file  . Years of education: Not on file  . Highest education level: Not on file  Occupational History  . Not on file  Social Needs  . Financial resource strain: Not on file  . Food insecurity    Worry: Not on file    Inability: Not on file  . Transportation needs    Medical: Not on file    Non-medical: Not on file  Tobacco Use  . Smoking status: Never Smoker  . Smokeless tobacco: Never Used  Substance and Sexual Activity  . Alcohol use: No  . Drug use: No  . Sexual activity: Not on file  Lifestyle  . Physical activity    Days per week: Not on file    Minutes per session: Not on file  . Stress: Not on file  Relationships  . Social Herbalist on phone: Not on file    Gets together: Not on file    Attends religious service: Not on file    Active member of club or organization: Not on file    Attends meetings of clubs or organizations: Not on file    Relationship status: Not on file  Other Topics Concern  . Not on file  Social History Narrative  . Not on file   Outpatient Encounter Medications as of 09/03/2018  Medication Sig  . apixaban (ELIQUIS) 5 MG TABS tablet Take 5 mg by mouth 2 (two) times  daily.  Marland Kitchen aspirin EC 81 MG tablet Take 81 mg by mouth daily.  . carvedilol (COREG) 12.5 MG tablet Take 12.5 mg by mouth 2 (two) times daily with a meal.  . Continuous Blood Gluc Sensor (FREESTYLE LIBRE 14 DAY SENSOR) MISC 1 each by Does not apply route every 14 (fourteen) days.  . Continuous Blood Gluc Sensor (FREESTYLE LIBRE SENSOR SYSTEM) MISC Use one sensor every 10 days.  Marland Kitchen esomeprazole (NEXIUM) 40 MG capsule Take 40 mg by mouth daily at 12 noon.  . Fluticasone-Salmeterol (ADVAIR) 250-50 MCG/DOSE AEPB Inhale 1 puff into the lungs 2 (two) times daily as needed.  . furosemide (LASIX) 20 MG tablet Take 20 mg by mouth 2 (two) times daily.  Marland Kitchen glucagon (GLUCAGON EMERGENCY) 1 MG injection Inject 1 mg into the vein once as needed.  Marland Kitchen glucose blood (CONTOUR NEXT TEST) test strip Use to test blood glucose 4 times a day. E11.65 (Patient not taking: Reported on 10/23/2017)  . ibuprofen (ADVIL,MOTRIN) 800 MG tablet Take 800 mg by mouth every 8 (eight) hours as needed.  . insulin aspart (NOVOLOG FLEXPEN) 100 UNIT/ML FlexPen Inject 5-11 Units into the skin 3 (three) times  daily with meals.  . insulin degludec (TRESIBA FLEXTOUCH) 100 UNIT/ML SOPN FlexTouch Pen Inject 0.56 mLs (56 Units total) into the skin at bedtime.  . Insulin Pen Needle (B-D ULTRAFINE III SHORT PEN) 31G X 8 MM MISC 1 each by Does not apply route as directed.  Marland Kitchen ipratropium-albuterol (DUONEB) 0.5-2.5 (3) MG/3ML SOLN Take 3 mLs by nebulization as directed.  . isosorbide dinitrate (ISORDIL) 30 MG tablet Take 30 mg by mouth daily.  Marland Kitchen levothyroxine (SYNTHROID, LEVOTHROID) 50 MCG tablet Take 50 mcg by mouth daily before breakfast.  . LORazepam (ATIVAN) 0.5 MG tablet Take 0.5 mg by mouth at bedtime.  Marland Kitchen losartan (COZAAR) 100 MG tablet Take 100 mg by mouth daily.  . meclizine (ANTIVERT) 25 MG tablet Take 25 mg by mouth 2 (two) times daily as needed for dizziness.  . Microlet Lancets MISC Use 4 times a day to test blood glucose.  . nitroGLYCERIN  (NITROSTAT) 0.4 MG SL tablet Place 0.4 mg under the tongue every 5 (five) minutes as needed for chest pain.  . potassium chloride (K-DUR) 10 MEQ tablet Take 10 mEq by mouth daily.  . predniSONE (DELTASONE) 10 MG tablet Take 10 mg by mouth daily with breakfast.  . ranitidine (ZANTAC) 150 MG capsule Take 150 mg by mouth 2 (two) times daily.  . sotalol (BETAPACE) 80 MG tablet Take 80 mg by mouth 2 (two) times daily.  . TERBINAFINE HCL PO Take by mouth.  . tiotropium (SPIRIVA) 18 MCG inhalation capsule Place 18 mcg into inhaler and inhale daily.  . [DISCONTINUED] insulin degludec (TRESIBA FLEXTOUCH) 100 UNIT/ML SOPN FlexTouch Pen Inject 0.54 mLs (54 Units total) into the skin at bedtime.   No facility-administered encounter medications on file as of 09/03/2018.    ALLERGIES: Allergies  Allergen Reactions  . [place] Nausea Only  . Plavix [Clopidogrel]   . Sucralfate    VACCINATION STATUS: Immunization History  Administered Date(s) Administered  . Influenza-Unspecified 12/28/2011  . Pneumococcal Conjugate-13 11/08/2013    Diabetes He presents for his follow-up diabetic visit. He has type 2 diabetes mellitus. Onset time: He was diagnosed at approximate age of 44 years. His disease course has been worsening. There are no hypoglycemic associated symptoms. Pertinent negatives for hypoglycemia include no confusion, headaches, pallor or seizures. There are no diabetic associated symptoms. Pertinent negatives for diabetes include no chest pain, no fatigue, no polydipsia, no polyphagia, no polyuria and no weakness. There are no hypoglycemic complications. Symptoms are worsening. Diabetic complications include a CVA and peripheral neuropathy. Risk factors for coronary artery disease include diabetes mellitus, hypertension, obesity and sedentary lifestyle. His weight is fluctuating minimally. He is following a generally unhealthy diet. When asked about meal planning, he reported none. He has not had a  previous visit with a dietitian. He participates in exercise intermittently. His home blood glucose trend is fluctuating dramatically. His breakfast blood glucose range is generally >200 mg/dl. His lunch blood glucose range is generally 180-200 mg/dl. His dinner blood glucose range is generally 180-200 mg/dl. His bedtime blood glucose range is generally >200 mg/dl. His overall blood glucose range is >200 mg/dl. (His previsit labs are not reported to review.  His A1c last visit was 9.1%.  He still returns with fluctuating glycemic profile averaging 244 over the last 30 days.   ) An ACE inhibitor/angiotensin II receptor blocker is being taken.  Hyperlipidemia This is a chronic problem. The current episode started more than 1 year ago. The problem is uncontrolled. Exacerbating diseases include diabetes  and hypothyroidism. Pertinent negatives include no chest pain, myalgias or shortness of breath. He is currently on no antihyperlipidemic treatment. Risk factors for coronary artery disease include diabetes mellitus, dyslipidemia, hypertension, a sedentary lifestyle, male sex and obesity.  Hypertension This is a chronic problem. The current episode started more than 1 year ago. Pertinent negatives include no chest pain, headaches, neck pain, palpitations or shortness of breath. Risk factors for coronary artery disease include diabetes mellitus, dyslipidemia and male gender. Past treatments include angiotensin blockers. Hypertensive end-organ damage includes CVA.     Review of Systems  Constitutional: Negative for chills, fatigue, fever and unexpected weight change.  HENT: Negative for dental problem, mouth sores and trouble swallowing.   Eyes: Negative for visual disturbance.  Respiratory: Negative for cough, choking, chest tightness, shortness of breath and wheezing.   Cardiovascular: Negative for chest pain, palpitations and leg swelling.  Gastrointestinal: Negative for abdominal distention, abdominal  pain, constipation, diarrhea, nausea and vomiting.  Endocrine: Negative for polydipsia, polyphagia and polyuria.  Genitourinary: Negative for dysuria, flank pain, hematuria and urgency.  Musculoskeletal: Negative for back pain, gait problem, myalgias and neck pain.  Skin: Negative for pallor, rash and wound.  Neurological: Negative for seizures, syncope, weakness, numbness and headaches.  Psychiatric/Behavioral: Negative.  Negative for confusion and dysphoric mood.    Objective:    BP (!) 150/69   Pulse (!) 58   Ht 6' (1.829 m)   Wt 214 lb (97.1 kg)   BMI 29.02 kg/m   Wt Readings from Last 3 Encounters:  09/03/18 214 lb (97.1 kg)  06/03/18 212 lb (96.2 kg)  03/03/18 221 lb (100.2 kg)    Physical Exam  Constitutional: He is oriented to person, place, and time. He appears well-developed. He is cooperative. No distress.  HENT:  Head: Normocephalic and atraumatic.  Eyes: EOM are normal.  Neck: Normal range of motion. Neck supple. No tracheal deviation present. No thyromegaly present.  Cardiovascular: Normal rate, S1 normal and S2 normal. Exam reveals no gallop.  No murmur heard. Pulses:      Dorsalis pedis pulses are 1+ on the right side and 1+ on the left side.       Posterior tibial pulses are 1+ on the right side and 1+ on the left side.  Pulmonary/Chest: Effort normal. No respiratory distress. He has no wheezes. He has no rales.  Abdominal: He exhibits no distension. There is no abdominal tenderness. There is no guarding and no CVA tenderness.  Musculoskeletal:        General: No edema.     Right shoulder: He exhibits no swelling and no deformity.  Neurological: He is alert and oriented to person, place, and time. He has normal strength and normal reflexes. No cranial nerve deficit or sensory deficit. Gait normal.  Skin: Skin is warm and dry. No rash noted. No cyanosis. Nails show no clubbing.  Psychiatric: He has a normal mood and affect. His speech is normal. Judgment  normal. Cognition and memory are normal.    Diabetic Labs (most recent): Lab Results  Component Value Date   HGBA1C 9.1 05/25/2018   HGBA1C 8.3 02/23/2018   HGBA1C 7.3 07/22/2017     Lipid Panel ( most recent) Lipid Panel     Component Value Date/Time   CHOL 151 12/17/2016   TRIG 124 12/17/2016   HDL 41 12/17/2016   LDLCALC 89 12/17/2016    Assessment & Plan:   1. DM type 2 causing vascular disease ([place])  - Patient  has currently uncontrolled symptomatic type 2 DM since 78 years of age. - He returns without any new labs, not reported from his lab.   -His average blood glucose is 244 analyzing his CGM.  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   -He has had difficulty getting supplies for his CGM, trying with a different company this time.     - his diabetes is complicated by CVA and patient remains at a high risk for more acute and chronic complications of diabetes which include CAD, CVA, CKD, retinopathy, and neuropathy. These are all discussed in detail with the patient.  - I have counseled the patient on diet management and weight loss, by adopting a carbohydrate restricted/protein rich diet.  - he  admits there is a room for improvement in his diet and drink choices. -  Suggestion is made for him to avoid simple carbohydrates  from his diet including Cakes, Sweet Desserts / Pastries, Ice Cream, Soda (diet and regular), Sweet Tea, Candies, Chips, Cookies, Sweet Pastries,  Store Bought Juices, Alcohol in Excess of  1-2 drinks a day, Artificial Sweeteners, Coffee Creamer, and "Sugar-free" Products. This will help patient to have stable blood glucose profile and potentially avoid unintended weight gain.  - I encouraged the patient to switch to  unprocessed or minimally processed complex starch and increased protein intake (animal or plant source), fruits, and vegetables.  - Patient is advised to  stick to a routine mealtimes to eat 3 meals  a day and avoid unnecessary snacks ( to snack only to correct hypoglycemia).   - I have approached patient with the following individualized plan to manage diabetes and patient agrees:   -  He is taking prednisone 10 mg by mouth every a.m. for chronic COPD.  I advised him to avoid sudden stopping of prednisone to prevent adrenal crisis.  -This patient will continue to require intensive treatment with basal/bolus insulin in order for him to achieve and maintain control of diabetes to target.    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 -He has had benefited tremendously from his CGM device until he could not get any longer from advanced diabetes supplies from [place], he is encouraged to continue to work with his new dispenser.    -He is advised to increase [place] to 56 units nightly, continue   NovoLog  5 units 3 times a day before meals for premeal BG readings of 70-164m/dl, plus patient specific correction dose for unexpected hyperglycemia above 1573mdl, associated with strict documenting of glucose   4 times a day-before meals and at bedtime. - Patient is warned not to take insulin without proper monitoring per orders. -Adjustment parameters are given for hypo and hyperglycemia in writing. -Patient is encouraged to call clinic for blood glucose levels less than 70 or above 300 mg /dl.  - Reportedly, he does not tolerate metformin, tried DPPIV  inhibitors before, and like to avoid those medications for now. - I have prescribed glucagon emergency kit. - His renal function continues to be normal.   - Patient specific target  A1c;  LDL, HDL, Triglycerides, and  Waist Circumference were discussed in detail.  2) BP/HTN: His blood pressure is above target.  He is advised to continue his current blood pressure medications including  losartan 100 mg p.o. daily, Lasix as needed,  carvedilol 12.5 mg p.o. twice daily.    3) Lipids/HPL: His recent lipid panel showed improving LDL to 89 from 107.  He is not on statin.  He wishes to avoid any further additional medications at this time.  He will be considered for repeat fasting lipid panel on subsequent visits.    4)  Weight/Diet: CDE Consult has been  initiated , exercise, and detailed carbohydrates information provided.  5) hypothyroidism: -His previsit labs for thyroid function tests are consistent with appropriate replacement.  He is advised to continue levothyroxine 50 mcg p.o. every morning.    - We discussed about the correct intake of his thyroid hormone, on empty stomach at fasting, with water, separated by at least 30 minutes from breakfast and other medications,  and separated by more than 4 hours from calcium, iron, multivitamins, acid reflux medications (PPIs). -Patient is made aware of the fact that thyroid hormone replacement is needed for life, dose to be adjusted by periodic monitoring of thyroid function tests.   6) Chronic Care/Health Maintenance:  -Patient is on ARB and encouraged to continue to follow up with Ophthalmology, Podiatrist at least yearly or according to recommendations, and advised to   stay away from smoking. I have recommended yearly flu vaccine and pneumonia vaccination at least every 5 years;  and  sleep for at least 7 hours a day. - I advised patient to maintain close follow up with Quentin Cornwall, MD for primary care needs.  - Time spent with the patient: 25 min, of which >50% was spent in reviewing his blood glucose logs , discussing his hypoglycemia and hyperglycemia episodes, reviewing his current and  previous labs / studies and medications  doses and developing a plan to avoid hypoglycemia and hyperglycemia. Please refer to Patient Instructions for Blood Glucose Monitoring and Insulin/Medications Dosing Guide"  in media tab for additional  information. Please  also refer to " Patient Self Inventory" in the Media  tab for reviewed elements of pertinent patient history.  Izell Noank Sr Megan Salon participated in the discussions, expressed understanding, and voiced agreement with the above plans.  All questions were answered to his satisfaction. he is encouraged to contact clinic should he have any questions or concerns prior to his return visit.   Follow up plan: - Return in about 4 months (around 01/03/2019) for Follow up with Pre-visit Labs, Meter, and Logs.  Glade Lloyd, MD Phone: [phone]  Fax: [phone]  -  This note was partially dictated with voice recognition software. Similar sounding words can be transcribed inadequately or may not  be corrected upon review.   09/03/2018, 1:11 PM

## 2018-09-03 NOTE — Patient Instructions (Signed)

## 2018-12-17 ENCOUNTER — Telehealth: Payer: Self-pay

## 2018-12-17 DIAGNOSIS — E1159 Type 2 diabetes mellitus with other circulatory complications: Secondary | ICD-10-CM

## 2018-12-17 MED ORDER — TRESIBA FLEXTOUCH 100 UNIT/ML ~~LOC~~ SOPN: 56.0000 [IU] | PEN_INJECTOR | Freq: Every day | SUBCUTANEOUS | 2 refills | Status: DC

## 2018-12-17 NOTE — Telephone Encounter (Signed)
LeighAnn Charlett Merkle, CMA  

## 2018-12-18 ENCOUNTER — Other Ambulatory Visit: Payer: Self-pay | Admitting: "Endocrinology

## 2018-12-24 LAB — TSH: TSH: 0.37 — AB (ref 0.41–5.90)

## 2018-12-24 LAB — BASIC METABOLIC PANEL
BUN: 21 (ref 4–21)
Creatinine: 1.2 (ref 0.6–1.3)

## 2018-12-24 LAB — HEMOGLOBIN A1C: Hemoglobin A1C: 9.3

## 2018-12-28 ENCOUNTER — Other Ambulatory Visit: Payer: Self-pay | Admitting: "Endocrinology

## 2018-12-28 ENCOUNTER — Telehealth: Payer: Self-pay | Admitting: "Endocrinology

## 2018-12-28 MED ORDER — NOVOLOG FLEXPEN 100 UNIT/ML ~~LOC~~ SOPN: PEN_INJECTOR | SUBCUTANEOUS | 2 refills | Status: DC

## 2018-12-28 MED ORDER — FREESTYLE LIBRE 14 DAY SENSOR MISC: 1.0000 | 2 refills | Status: AC

## 2018-12-28 NOTE — Telephone Encounter (Signed)
Patient needs refill on Continuous Blood Gluc Sensor (FREESTYLE LIBRE 14 DAY SENSOR) MISC. [facility]

## 2018-12-28 NOTE — Telephone Encounter (Signed)
RX sent

## 2018-12-28 NOTE — Addendum Note (Signed)
Addended by: Lavell Luster A on: 12/28/2018 08:55 AM   Modules accepted: Orders

## 2018-12-31 ENCOUNTER — Telehealth: Payer: Self-pay | Admitting: "Endocrinology

## 2018-12-31 MED ORDER — FREESTYLE LIBRE SENSOR SYSTEM MISC: 2 refills | Status: AC

## 2018-12-31 NOTE — Telephone Encounter (Signed)
Records and refill for [facility] sensors sent to [facility] medical supply

## 2019-01-05 ENCOUNTER — Encounter: Payer: Self-pay | Admitting: "Endocrinology

## 2019-01-05 ENCOUNTER — Ambulatory Visit (INDEPENDENT_AMBULATORY_CARE_PROVIDER_SITE_OTHER): Payer: Medicare Other | Admitting: "Endocrinology

## 2019-01-05 ENCOUNTER — Other Ambulatory Visit: Payer: Self-pay

## 2019-01-05 DIAGNOSIS — E782 Mixed hyperlipidemia: Secondary | ICD-10-CM | POA: Diagnosis not present

## 2019-01-05 DIAGNOSIS — E1159 Type 2 diabetes mellitus with other circulatory complications: Secondary | ICD-10-CM | POA: Diagnosis not present

## 2019-01-05 DIAGNOSIS — I10 Essential (primary) hypertension: Secondary | ICD-10-CM | POA: Diagnosis not present

## 2019-01-05 DIAGNOSIS — E039 Hypothyroidism, unspecified: Secondary | ICD-10-CM | POA: Diagnosis not present

## 2019-01-05 MED ORDER — GLIPIZIDE ER 5 MG PO TB24: 5.0000 mg | ORAL_TABLET | Freq: Every day | ORAL | 3 refills | Status: DC

## 2019-01-05 NOTE — Progress Notes (Signed)
01/05/2019                                                    Endocrinology Telehealth Visit Follow up Note -During COVID -19 Pandemic  This visit type was conducted due to national recommendations for restrictions regarding the COVID-19 Pandemic  in an effort to limit this patient's exposure and mitigate transmission of the corona virus.  Due to his co-morbid illnesses, Barry Turner is at  moderate to high risk for complications without adequate follow up.  This format is felt to be most appropriate for him at this time.  I connected with this patient on 01/05/2019   by telephone and verified that I am speaking with the correct person using two identifiers. Barry Turner Megan Salon, 11/03/1940. he has verbally consented to this visit. All issues noted in this document were discussed and addressed. The format was not optimal for physical exam.    Subjective:    Patient ID: Barry Turner, male    DOB: Aug 04, 1940. Patient is being engaged in telehealth via telephone in follow up for management of type 2  Diabetes, hypothyroidism, hyperlipidemia, hypertension. PMD:  Quentin Cornwall, MD  Past Medical History:  Diagnosis Date  . CHF (congestive heart failure) ([place])   . COPD (chronic obstructive pulmonary disease) ([place])   . Coronary artery disease   . Diabetes mellitus, type II ([place])   . Dysthymic disorder   . Hyperlipidemia   . Hypothyroidism    Past Surgical History:  Procedure Laterality Date  . CHOLECYSTECTOMY    . CORONARY ARTERY BYPASS GRAFT    . MEDIASTINOSCOPY     Social History   Socioeconomic History  . Marital status: Married    Spouse name: Not on file  . Number of children: Not on file  . Years of education: Not on file  . Highest education level: Not on file  Occupational History  . Not on file  Social Needs  . Financial resource strain: Not on file  . Food insecurity    Worry: Not on file    Inability: Not on file  . Transportation needs    Medical:  Not on file    Non-medical: Not on file  Tobacco Use  . Smoking status: Never Smoker  . Smokeless tobacco: Never Used  Substance and Sexual Activity  . Alcohol use: No  . Drug use: No  . Sexual activity: Not on file  Lifestyle  . Physical activity    Days per week: Not on file    Minutes per session: Not on file  . Stress: Not on file  Relationships  . Social Herbalist on phone: Not on file    Gets together: Not on file    Attends religious service: Not on file    Active member of club or organization: Not on file    Attends meetings of clubs or organizations: Not on file    Relationship status: Not on file  Other Topics Concern  . Not on file  Social History Narrative  . Not on file   Outpatient Encounter Medications as of 01/05/2019  Medication Sig  . apixaban (ELIQUIS) 5 MG TABS tablet Take 5 mg by mouth 2 (two) times daily.  Marland Kitchen aspirin EC 81 MG tablet Take 81 mg by mouth  daily.  . carvedilol (COREG) 12.5 MG tablet Take 12.5 mg by mouth 2 (two) times daily with a meal.  . Continuous Blood Gluc Sensor (FREESTYLE LIBRE 14 DAY SENSOR) MISC 1 each by Does not apply route every 14 (fourteen) days.  . Continuous Blood Gluc Sensor (FREESTYLE LIBRE SENSOR SYSTEM) MISC Use one sensor every 10 days.  Marland Kitchen esomeprazole (NEXIUM) 40 MG capsule Take 40 mg by mouth daily at 12 noon.  . Fluticasone-Salmeterol (ADVAIR) 250-50 MCG/DOSE AEPB Inhale 1 puff into the lungs 2 (two) times daily as needed.  . furosemide (LASIX) 20 MG tablet Take 20 mg by mouth 2 (two) times daily.  Marland Kitchen glipiZIDE (GLUCOTROL XL) 5 MG 24 hr tablet Take 1 tablet (5 mg total) by mouth daily with breakfast.  . glucagon (GLUCAGON EMERGENCY) 1 MG injection Inject 1 mg into the vein once as needed.  Marland Kitchen glucose blood (CONTOUR NEXT TEST) test strip Use to test blood glucose 4 times a day. E11.65 (Patient not taking: Reported on 10/23/2017)  . ibuprofen (ADVIL,MOTRIN) 800 MG tablet Take 800 mg by mouth every 8 (eight) hours  as needed.  . insulin aspart (NOVOLOG FLEXPEN) 100 UNIT/ML FlexPen INJECT 5-11 UNITS SUBCUTANEOUSLY 3 TIMES DAILY WITH MEALS  . insulin degludec (TRESIBA FLEXTOUCH) 100 UNIT/ML SOPN FlexTouch Pen Inject 0.56 mLs (56 Units total) into the skin at bedtime.  . Insulin Pen Needle (B-D ULTRAFINE III SHORT PEN) 31G X 8 MM MISC 1 each by Does not apply route as directed.  Marland Kitchen ipratropium-albuterol (DUONEB) 0.5-2.5 (3) MG/3ML SOLN Take 3 mLs by nebulization as directed.  . isosorbide dinitrate (ISORDIL) 30 MG tablet Take 30 mg by mouth daily.  Marland Kitchen levothyroxine (SYNTHROID, LEVOTHROID) 50 MCG tablet Take 50 mcg by mouth daily before breakfast.  . LORazepam (ATIVAN) 0.5 MG tablet Take 0.5 mg by mouth at bedtime.  Marland Kitchen losartan (COZAAR) 100 MG tablet Take 100 mg by mouth daily.  . meclizine (ANTIVERT) 25 MG tablet Take 25 mg by mouth 2 (two) times daily as needed for dizziness.  . Microlet Lancets MISC Use 4 times a day to test blood glucose.  . nitroGLYCERIN (NITROSTAT) 0.4 MG SL tablet Place 0.4 mg under the tongue every 5 (five) minutes as needed for chest pain.  . potassium chloride (K-DUR) 10 MEQ tablet Take 10 mEq by mouth daily.  . predniSONE (DELTASONE) 10 MG tablet Take 10 mg by mouth daily with breakfast.  . ranitidine (ZANTAC) 150 MG capsule Take 150 mg by mouth 2 (two) times daily.  . sotalol (BETAPACE) 80 MG tablet Take 80 mg by mouth 2 (two) times daily.  . TERBINAFINE HCL PO Take by mouth.  . tiotropium (SPIRIVA) 18 MCG inhalation capsule Place 18 mcg into inhaler and inhale daily.   No facility-administered encounter medications on file as of 01/05/2019.    ALLERGIES: Allergies  Allergen Reactions  . Lac Bovis Nausea Only  . Plavix [Clopidogrel]   . Sucralfate    VACCINATION STATUS: Immunization History  Administered Date(s) Administered  . Influenza-Unspecified 12/28/2011  . Pneumococcal Conjugate-13 11/08/2013    Diabetes He presents for his follow-up diabetic visit. He has type 2  diabetes mellitus. Onset time: He was diagnosed at approximate age of 36 years. His disease course has been worsening. There are no hypoglycemic associated symptoms. Pertinent negatives for hypoglycemia include no confusion, headaches, pallor or seizures. There are no diabetic associated symptoms. Pertinent negatives for diabetes include no chest pain, no fatigue, no polydipsia, no polyphagia, no polyuria and no  weakness. There are no hypoglycemic complications. Symptoms are worsening. Diabetic complications include a CVA and peripheral neuropathy. Risk factors for coronary artery disease include diabetes mellitus, hypertension, obesity and sedentary lifestyle. His weight is fluctuating minimally. He is following a generally unhealthy diet. When asked about meal planning, he reported none. He has not had a previous visit with a dietitian. He participates in exercise intermittently. His home blood glucose trend is fluctuating dramatically. His breakfast blood glucose range is generally >200 mg/dl. His lunch blood glucose range is generally 180-200 mg/dl. His dinner blood glucose range is generally 180-200 mg/dl. His bedtime blood glucose range is generally >200 mg/dl. His overall blood glucose range is >200 mg/dl. (His previsit labs are not reported to review.  His A1c last visit was 9.1%.  He still returns with fluctuating glycemic profile averaging 244 over the last 30 days.   ) An ACE inhibitor/angiotensin II receptor blocker is being taken.  Hyperlipidemia This is a chronic problem. The current episode started more than 1 year ago. The problem is uncontrolled. Exacerbating diseases include diabetes and hypothyroidism. Pertinent negatives include no chest pain, myalgias or shortness of breath. He is currently on no antihyperlipidemic treatment. Risk factors for coronary artery disease include diabetes mellitus, dyslipidemia, hypertension, a sedentary lifestyle, male sex and obesity.  Hypertension This is a  chronic problem. The current episode started more than 1 year ago. Pertinent negatives include no chest pain, headaches, neck pain, palpitations or shortness of breath. Risk factors for coronary artery disease include diabetes mellitus, dyslipidemia and male gender. Past treatments include angiotensin blockers. Hypertensive end-organ damage includes CVA.   Review of systems: Limited as above.    Objective:    There were no vitals taken for this visit.  Wt Readings from Last 3 Encounters:  09/03/18 214 lb (97.1 kg)  06/03/18 212 lb (96.2 kg)  03/03/18 221 lb (100.2 kg)     Diabetic Labs (most recent): Lab Results  Component Value Date   HGBA1C 9.3 12/24/2018   HGBA1C 9.1 05/25/2018   HGBA1C 8.3 02/23/2018     Lipid Panel ( most recent) Lipid Panel     Component Value Date/Time   CHOL 151 12/17/2016   TRIG 124 12/17/2016   HDL 41 12/17/2016   LDLCALC 89 12/17/2016    Assessment & Plan:   1. DM type 2 causing vascular disease ([place])  - Patient has currently uncontrolled symptomatic type 2 DM since 78 years of age. - He reports significantly above target glycemic profile, however encouraged by the fact that he did not have hypoglycemia anymore.    -His morning blood glucose readings range from 114- 2091, lunchtime readings range from 83-279, presupper readings from 135-213, bedtime readings from 280- 345.  -  Patient has had bad experience with hypoglycemia, and trying to avoid hypoglycemia as much as he can.  He worries about hypoglycemia. -He has had difficulty getting supplies for his CGM, trying with a different company this time.     - his diabetes is complicated by CVA and patient remains at a high risk for more acute and chronic complications of diabetes which include CAD, CVA, CKD, retinopathy, and neuropathy. These are all discussed in detail with the patient.  - I have counseled the patient on diet management and weight loss, by adopting a carbohydrate  restricted/protein rich diet.  - he  admits there is a room for improvement in his diet and drink choices. -  Suggestion is made for him to avoid simple  carbohydrates  from his diet including Cakes, Sweet Desserts / Pastries, Ice Cream, Soda (diet and regular), Sweet Tea, Candies, Chips, Cookies, Sweet Pastries,  Store Bought Juices, Alcohol in Excess of  1-2 drinks a day, Artificial Sweeteners, Coffee Creamer, and "Sugar-free" Products. This will help patient to have stable blood glucose profile and potentially avoid unintended weight gain.  - I encouraged the patient to switch to  unprocessed or minimally processed complex starch and increased protein intake (animal or plant source), fruits, and vegetables.  - Patient is advised to stick to a routine mealtimes to eat 3 meals  a day and avoid unnecessary snacks ( to snack only to correct hypoglycemia).   - I have approached patient with the following individualized plan to manage diabetes and patient agrees:   -  He is taking prednisone 10 mg by mouth every a.m. for chronic COPD.  I advised him to avoid sudden stopping of prednisone to prevent adrenal crisis.  -This patient will continue to require intensive treatment with basal/bolus insulin in order for him to achieve and maintain control of diabetes to target.    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 -He has had benefited tremendously from his CGM device until he could not get any longer from advanced diabetes supplies from [place], he is encouraged to continue to work with his new dispenser.    -He is advised to continue Tresiba 56 units nightly, continue NovoLog   5 units 3 times a day before meals for premeal BG readings of 70-164m/dl, plus patient specific correction dose for unexpected hyperglycemia above 1537mdl, associated with strict documenting of glucose   4  times a day-before meals and at bedtime. - Patient is warned not to take insulin without proper monitoring per orders. -Adjustment parameters are given for hypo and hyperglycemia in writing. -Patient is encouraged to call clinic for blood glucose levels less than 70 or above 300 mg /dl.  - Reportedly, he does not tolerate metformin, tried DPPIV  inhibitors before, and like to avoid those medications for now. -He will benefit from low-dose glipizide therapy.  I discussed and added glipizide XL 5 mg p.o. daily at breakfast. - I have prescribed glucagon emergency kit. - His renal function continues to be normal.   - Patient specific target  A1c;  LDL, HDL, Triglycerides, and  Waist Circumference were discussed in detail.  2) BP/HTN: he is advised to home monitor blood pressure and report if > 140/90 on 2 separate readings.  He is advised to continue his current blood pressure medications including losartan 100 mg p.o. daily, Lasix as needed, carvedilol 12.5 mg p.o. twice daily.    3) Lipids/HPL: His recent lipid panel showed improving LDL to 89 from 107.  He is not on statin.  He wishes to avoid any further additional medications at this time.  He will be considered for repeat fasting lipid panel on subsequent visits.    4)  Weight/Diet: CDE Consult has been  initiated , exercise, and detailed carbohydrates information provided.  5) hypothyroidism: -His previsit labs for thyroid function tests are consistent with appropriate replacement.  He is advised to continue  levothyroxine 50 mcg p.o. every morning.    - We discussed about the correct intake of his thyroid hormone, on empty stomach at fasting, with water, separated by at least 30 minutes from breakfast and other medications,  and  separated by more than 4 hours from calcium, iron, multivitamins, acid reflux medications (PPIs). -Patient is made aware of the fact that thyroid hormone replacement is needed for life, dose to be adjusted by  periodic monitoring of thyroid function tests.   6) Chronic Care/Health Maintenance:  -Patient is on ARB and encouraged to continue to follow up with Ophthalmology, Podiatrist at least yearly or according to recommendations, and advised to   stay away from smoking. I have recommended yearly flu vaccine and pneumonia vaccination at least every 5 years;  and  sleep for at least 7 hours a day. - I advised patient to maintain close follow up with Quentin Cornwall, MD for primary care needs.  - Patient Care Time Today:  25 min, of which >50% was spent in  counseling and the rest reviewing his  current and  previous labs/studies, previous treatments, his blood glucose readings, and medications' doses and developing a plan for long-term care based on the latest recommendations for standards of care.   Barry [place] Megan Salon participated in the discussions, expressed understanding, and voiced agreement with the above plans.  All questions were answered to his satisfaction. he is encouraged to contact clinic should he have any questions or concerns prior to his return visit.    Follow up plan: - Return in about 3 months (around 04/07/2019) for Bring Meter and Logs- A1c in Office, Include 8 log sheets.  Glade Lloyd, MD Phone: [phone]  Fax: [phone]  -  This note was partially dictated with voice recognition software. Similar sounding words can be transcribed inadequately or may not  be corrected upon review.   01/05/2019, 10:34 AM

## 2019-01-14 ENCOUNTER — Other Ambulatory Visit: Payer: Self-pay | Admitting: "Endocrinology

## 2019-01-18 ENCOUNTER — Other Ambulatory Visit: Payer: Self-pay | Admitting: "Endocrinology

## 2019-03-09 ENCOUNTER — Other Ambulatory Visit: Payer: Self-pay | Admitting: "Endocrinology

## 2019-03-09 DIAGNOSIS — E1159 Type 2 diabetes mellitus with other circulatory complications: Secondary | ICD-10-CM

## 2019-04-08 ENCOUNTER — Ambulatory Visit: Payer: Medicare Other | Admitting: "Endocrinology

## 2019-04-26 ENCOUNTER — Other Ambulatory Visit: Payer: Self-pay

## 2019-04-26 ENCOUNTER — Encounter: Payer: Self-pay | Admitting: "Endocrinology

## 2019-04-26 ENCOUNTER — Ambulatory Visit: Payer: Medicare Other | Admitting: "Endocrinology

## 2019-04-26 VITALS — BP 168/76 | HR 54 | Ht 72.0 in | Wt 220.4 lb

## 2019-04-26 DIAGNOSIS — I10 Essential (primary) hypertension: Secondary | ICD-10-CM | POA: Diagnosis not present

## 2019-04-26 DIAGNOSIS — E782 Mixed hyperlipidemia: Secondary | ICD-10-CM

## 2019-04-26 DIAGNOSIS — E1159 Type 2 diabetes mellitus with other circulatory complications: Secondary | ICD-10-CM

## 2019-04-26 DIAGNOSIS — E039 Hypothyroidism, unspecified: Secondary | ICD-10-CM | POA: Diagnosis not present

## 2019-04-26 LAB — POCT GLYCOSYLATED HEMOGLOBIN (HGB A1C): Hemoglobin A1C: 9.2 % — AB (ref 4.0–5.6)

## 2019-04-26 MED ORDER — NOVOLOG FLEXPEN 100 UNIT/ML ~~LOC~~ SOPN: 4.0000 [IU] | PEN_INJECTOR | Freq: Three times a day (TID) | SUBCUTANEOUS | 2 refills | Status: DC

## 2019-04-26 MED ORDER — TRESIBA FLEXTOUCH 100 UNIT/ML ~~LOC~~ SOPN: 50.0000 [IU] | PEN_INJECTOR | Freq: Every day | SUBCUTANEOUS | 2 refills | Status: DC

## 2019-04-26 NOTE — Patient Instructions (Signed)

## 2019-04-26 NOTE — Progress Notes (Signed)
04/26/2019                                                    Endocrinology Telehealth Visit Follow up Note -During COVID -19 Pandemic  This visit type was conducted due to national recommendations for restrictions regarding the COVID-19 Pandemic  in an effort to limit this patient's exposure and mitigate transmission of the corona virus.  Due to his co-morbid illnesses, Barry Turner is at  moderate to high risk for complications without adequate follow up.  This format is felt to be most appropriate for him at this time.  I connected with this patient on 04/26/2019   by telephone and verified that I am speaking with the correct person using two identifiers. Barry Turner, 26-Nov-1940. he has verbally consented to this visit. All issues noted in this document were discussed and addressed. The format was not optimal for physical exam.    Subjective:    Patient ID: Barry Turner, male    DOB: 09-18-1940. Patient is being engaged in telehealth via telephone in follow up for management of type 2  Diabetes, hypothyroidism, hyperlipidemia, hypertension. PMD:  Quentin Cornwall, MD  Past Medical History:  Diagnosis Date  . CHF (congestive heart failure) ([place])   . COPD (chronic obstructive pulmonary disease) ([place])   . Coronary artery disease   . Diabetes mellitus, type II ([place])   . Dysthymic disorder   . Hyperlipidemia   . Hypothyroidism    Past Surgical History:  Procedure Laterality Date  . CHOLECYSTECTOMY    . CORONARY ARTERY BYPASS GRAFT    . MEDIASTINOSCOPY     Social History   Socioeconomic History  . Marital status: Married    Spouse name: Not on file  . Number of children: Not on file  . Years of education: Not on file  . Highest education level: Not on file  Occupational History  . Not on file  Tobacco Use  . Smoking status: Never Smoker  . Smokeless tobacco: Never Used  Substance and Sexual Activity  . Alcohol use: No  . Drug use: No  . Sexual activity:  Not on file  Other Topics Concern  . Not on file  Social History Narrative  . Not on file   Social Determinants of Health   Financial Resource Strain:   . Difficulty of Paying Living Expenses: Not on file  Food Insecurity:   . Worried About Charity fundraiser in the Last Year: Not on file  . Ran Out of Food in the Last Year: Not on file  Transportation Needs:   . Lack of Transportation (Medical): Not on file  . Lack of Transportation (Non-Medical): Not on file  Physical Activity:   . Days of Exercise per Week: Not on file  . Minutes of Exercise per Session: Not on file  Stress:   . Feeling of Stress : Not on file  Social Connections:   . Frequency of Communication with Friends and Family: Not on file  . Frequency of Social Gatherings with Friends and Family: Not on file  . Attends Religious Services: Not on file  . Active Member of Clubs or Organizations: Not on file  . Attends Archivist Meetings: Not on file  . Marital Status: Not on file   Outpatient Encounter  Medications as of 04/26/2019  Medication Sig  . apixaban (ELIQUIS) 5 MG TABS tablet Take 5 mg by mouth 2 (two) times daily.  Marland Kitchen aspirin EC 81 MG tablet Take 81 mg by mouth daily.  . carvedilol (COREG) 12.5 MG tablet Take 12.5 mg by mouth 2 (two) times daily with a meal.  . Continuous Blood Gluc Sensor (FREESTYLE LIBRE 14 DAY SENSOR) MISC 1 each by Does not apply route every 14 (fourteen) days.  . Continuous Blood Gluc Sensor (FREESTYLE LIBRE SENSOR SYSTEM) MISC Use one sensor every 10 days.  Marland Kitchen esomeprazole (NEXIUM) 40 MG capsule Take 40 mg by mouth daily at 12 noon.  . Fluticasone-Salmeterol (ADVAIR) 250-50 MCG/DOSE AEPB Inhale 1 puff into the lungs 2 (two) times daily as needed.  . furosemide (LASIX) 20 MG tablet Take 20 mg by mouth 2 (two) times daily.  Marland Kitchen glipiZIDE (GLUCOTROL XL) 5 MG 24 hr tablet Take 1 tablet (5 mg total) by mouth daily with breakfast.  . GLOBAL EASE INJECT PEN NEEDLES 31G X 8 MM MISC USE  AS DIRECTED  . glucagon (GLUCAGON EMERGENCY) 1 MG injection Inject 1 mg into the vein once as needed.  Marland Kitchen glucose blood (CONTOUR NEXT TEST) test strip Use to test blood glucose 4 times a day. E11.65 (Patient not taking: Reported on 10/23/2017)  . ibuprofen (ADVIL,MOTRIN) 800 MG tablet Take 800 mg by mouth every 8 (eight) hours as needed.  . insulin aspart (NOVOLOG FLEXPEN) 100 UNIT/ML FlexPen Inject 4-7 Units into the skin 3 (three) times daily before meals. INJECT 5-11 UNITS SUBCUTANEOUSLY 3 TIMES DAILY WITH MEALS  . insulin degludec (TRESIBA FLEXTOUCH) 100 UNIT/ML SOPN FlexTouch Pen Inject 0.5 mLs (50 Units total) into the skin at bedtime.  Marland Kitchen ipratropium-albuterol (DUONEB) 0.5-2.5 (3) MG/3ML SOLN Take 3 mLs by nebulization as directed.  . isosorbide dinitrate (ISORDIL) 30 MG tablet Take 30 mg by mouth daily.  Marland Kitchen levothyroxine (SYNTHROID, LEVOTHROID) 50 MCG tablet Take 50 mcg by mouth daily before breakfast.  . LORazepam (ATIVAN) 0.5 MG tablet Take 0.5 mg by mouth at bedtime.  Marland Kitchen losartan (COZAAR) 100 MG tablet Take 100 mg by mouth daily.  . meclizine (ANTIVERT) 25 MG tablet Take 25 mg by mouth 2 (two) times daily as needed for dizziness.  . Microlet Lancets MISC Use 4 times a day to test blood glucose.  . nitroGLYCERIN (NITROSTAT) 0.4 MG SL tablet Place 0.4 mg under the tongue every 5 (five) minutes as needed for chest pain.  . potassium chloride (K-DUR) 10 MEQ tablet Take 10 mEq by mouth daily.  . predniSONE (DELTASONE) 10 MG tablet Take 10 mg by mouth daily with breakfast.  . ranitidine (ZANTAC) 150 MG capsule Take 150 mg by mouth 2 (two) times daily.  . sotalol (BETAPACE) 80 MG tablet Take 80 mg by mouth 2 (two) times daily.  . TERBINAFINE HCL PO Take by mouth.  . tiotropium (SPIRIVA) 18 MCG inhalation capsule Place 18 mcg into inhaler and inhale daily.  . [DISCONTINUED] insulin aspart (NOVOLOG FLEXPEN) 100 UNIT/ML FlexPen INJECT 5-11 UNITS SUBCUTANEOUSLY 3 TIMES DAILY WITH MEALS  .  [DISCONTINUED] TRESIBA FLEXTOUCH 100 UNIT/ML SOPN FlexTouch Pen INJECT 56 UNITS TOTAL (0.56 MLS) INTO THE SKIN DAILY AT BEDTIME   No facility-administered encounter medications on file as of 04/26/2019.   ALLERGIES: Allergies  Allergen Reactions  . [place] Nausea Only  . Plavix [Clopidogrel]   . Sucralfate    VACCINATION STATUS: Immunization History  Administered Date(s) Administered  . Influenza-Unspecified 12/28/2011  .  Pneumococcal Conjugate-13 11/08/2013    Diabetes He presents for his follow-up diabetic visit. He has type 2 diabetes mellitus. Onset time: He was diagnosed at approximate age of 89 years. His disease course has been worsening. There are no hypoglycemic associated symptoms. Pertinent negatives for hypoglycemia include no confusion, headaches, pallor or seizures. There are no diabetic associated symptoms. Pertinent negatives for diabetes include no chest pain, no fatigue, no polydipsia, no polyphagia, no polyuria and no weakness. There are no hypoglycemic complications. Symptoms are worsening. Diabetic complications include a CVA and peripheral neuropathy. Risk factors for coronary artery disease include diabetes mellitus, hypertension, obesity and sedentary lifestyle. His weight is increasing steadily. He is following a generally unhealthy diet. When asked about meal planning, he reported none. He has not had a previous visit with a dietitian. He participates in exercise intermittently. His home blood glucose trend is fluctuating dramatically. His breakfast blood glucose range is generally >200 mg/dl. His lunch blood glucose range is generally 180-200 mg/dl. His dinner blood glucose range is generally >200 mg/dl. His bedtime blood glucose range is generally >200 mg/dl. His overall blood glucose range is >200 mg/dl. (His presents with his Dexcom records and he is iPhone.  His average blood glucose is between 200-250 mg.  And point-of-care A1c 9.2% unchanged from his last check.    Except for some rare and random mild hypoglycemia into the 50s and 60s, greater than 95% of the time he is hyperglycemic.  This is mainly due to the fact that he did not use his NovoLog properly.  ) An ACE inhibitor/angiotensin II receptor blocker is being taken.  Hyperlipidemia This is a chronic problem. The current episode started more than 1 year ago. The problem is uncontrolled. Exacerbating diseases include diabetes and hypothyroidism. Pertinent negatives include no chest pain, myalgias or shortness of breath. He is currently on no antihyperlipidemic treatment. Risk factors for coronary artery disease include diabetes mellitus, dyslipidemia, hypertension, a sedentary lifestyle, male sex and obesity.  Hypertension This is a chronic problem. The current episode started more than 1 year ago. Pertinent negatives include no chest pain, headaches, neck pain, palpitations or shortness of breath. Risk factors for coronary artery disease include diabetes mellitus, dyslipidemia and male gender. Past treatments include angiotensin blockers. Hypertensive end-organ damage includes CVA.   Review of systems:   Review of systems  Constitutional: + Minimally fluctuating body weight,  current  Body mass index is 29.89 kg/m. , no fatigue, no subjective hyperthermia, no subjective hypothermia Eyes: no blurry vision, no xerophthalmia ENT: no sore throat, no nodules palpated in throat, no dysphagia/odynophagia, no hoarseness Cardiovascular: no Chest Pain, no Shortness of Breath, no palpitations, no leg swelling Respiratory: no cough, no shortness of breath Gastrointestinal: no Nausea/Vomiting/Diarhhea Musculoskeletal: no muscle/joint aches Skin: no rashes Neurological: no tremors, no numbness, no tingling, no dizziness Psychiatric: no depression, no anxiety     Objective:    BP (!) 168/76   Pulse (!) 54   Ht 6' (1.829 m)   Wt 220 lb 6.4 oz (100 kg)   BMI 29.89 kg/m   Wt Readings from Last 3  Encounters:  04/26/19 220 lb 6.4 oz (100 kg)  09/03/18 214 lb (97.1 kg)  06/03/18 212 lb (96.2 kg)     Physical Exam- Limited  Constitutional:  Body mass index is 29.89 kg/m. , not in acute distress, normal state of mind Eyes:  EOMI, no exophthalmos Neck: Supple Respiratory: Adequate breathing efforts Musculoskeletal: no gross deformities, strength intact in all four  extremities, no gross restriction of joint movements, + bilateral pedal, pretibial edema. Skin:  no rashes, no hyperemia Neurological: no tremor with outstretched hands.  Diabetic Labs (most recent): Lab Results  Component Value Date   HGBA1C 9.2 (A) 04/26/2019   HGBA1C 9.3 12/24/2018   HGBA1C 9.1 05/25/2018     Lipid Panel ( most recent) Lipid Panel     Component Value Date/Time   CHOL 151 12/17/2016 0000   TRIG 124 12/17/2016 0000   HDL 41 12/17/2016 0000   LDLCALC 89 12/17/2016 0000    Assessment & Plan:   1. DM type 2 causing vascular disease ([place])  - Patient has currently uncontrolled symptomatic type 2 DM since 79 years of age. - He reports significantly above target glycemic profile, however encouraged by the fact that he did not have hypoglycemia anymore.    -His CGM analysis shows significant hyperglycemia.  He does have rare, random hypoglycemia happening mostly at lunchtime.   He did not use his NovoLog properly.  Missed at least 75% of NovoLog injection opportunities.   His point-of-care A1c is 9.2%, unchanged from 9.3% A1c at last visit.  -  Patient has had bad experience with hypoglycemia, and trying to avoid hypoglycemia as much as he can.  He worries about hypoglycemia.   - his diabetes is complicated by CVA and patient remains at a high risk for more acute and chronic complications of diabetes which include CAD, CVA, CKD, retinopathy, and neuropathy. These are all discussed in detail with the patient.  - I have counseled the patient on diet management and weight loss, by adopting a  carbohydrate restricted/protein rich diet.  - he  admits there is a room for improvement in his diet and drink choices. -  Suggestion is made for him to avoid simple carbohydrates  from his diet including Cakes, Sweet Desserts / Pastries, Ice Cream, Soda (diet and regular), Sweet Tea, Candies, Chips, Cookies, Sweet Pastries,  Store Bought Juices, Alcohol in Excess of  1-2 drinks a day, Artificial Sweeteners, Coffee Creamer, and "Sugar-free" Products. This will help patient to have stable blood glucose profile and potentially avoid unintended weight gain.   - I encouraged the patient to switch to  unprocessed or minimally processed complex starch and increased protein intake (animal or plant source), fruits, and vegetables.  - Patient is advised to stick to a routine mealtimes to eat 3 meals  a day and avoid unnecessary snacks ( to snack only to correct hypoglycemia).   - I have approached patient with the following individualized plan to manage diabetes and patient agrees:   -  He is still taking prednisone 10 mg by mouth every a.m. for chronic COPD.  I advised him to avoid sudden stopping of prednisone to prevent adrenal crisis.  -This patient will continue to require intensive treatment with basal/bolus insulin in order for him to achieve and maintain control of diabetes to target.    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 -He has had benefited tremendously from his CGM device, he is encouraged to use it at all times.      -He is advised to lower the [place] to 50 units nightly, advised to be consistent in using his NovoLog  4 units 3 times a day before meals for premeal BG readings of 90-126m/dl, plus patient specific correction dose for unexpected hyperglycemia above 1577mdl, associated with  strict documenting of glucose   4 times a day-before meals and at bedtime. -  Patient is warned not to take insulin without proper monitoring per orders. -Adjustment parameters are given for hypo and hyperglycemia in writing. -Patient is encouraged to call clinic for blood glucose levels less than 70 or above 300 mg /dl.  - Reportedly, he does not tolerate metformin, tried DPPIV  inhibitors before, and like to avoid those medications for now. -He  is advised to continue glipizide XL 5 mg p.o. daily at breakfast. - I have prescribed glucagon emergency kit. - His renal function continues to be normal.   - Patient specific target  A1c;  LDL, HDL, Triglycerides, and  Waist Circumference were discussed in detail.  2) BP/HTN: His blood pressure is not controlled to target.  He is advised to continue his current blood pressure medications including losartan 100 mg p.o. daily, Lasix as needed, carvedilol 12.5 mg p.o. twice daily.    3) Lipids/HPL: His recent lipid panel showed improving LDL to 89 from 107.  He is not on statin.  He wishes to avoid any further addition of medication at this time.  He will be considered for repeat fasting lipid panel on subsequent visits.    4)  Weight/Diet: CDE Consult has been  initiated , exercise, and detailed carbohydrates information provided.  5) hypothyroidism: -His previsit labs for thyroid function tests are consistent with appropriate replacement.  He is advised to continue  levothyroxine 50 mcg p.o. every morning.    - We discussed about the correct intake of his thyroid hormone, on empty stomach at fasting, with water, separated by at least 30 minutes from breakfast and other medications,  and separated by more than 4 hours from calcium, iron, multivitamins, acid reflux medications (PPIs). -Patient is made aware of the fact that thyroid hormone replacement is needed for life, dose to be adjusted by periodic monitoring of thyroid function tests.   6) Chronic Care/Health Maintenance:  -Patient is on ARB and encouraged to continue  to follow up with Ophthalmology, Podiatrist at least yearly or according to recommendations, and advised to   stay away from smoking. I have recommended yearly flu vaccine and pneumonia vaccination at least every 5 years;  and  sleep for at least 7 hours a day. - I advised patient to maintain close follow up with Quentin Cornwall, MD for primary care needs.  - Time spent on this patient care encounter:  35 min, of which > 50% was spent in  counseling and the rest reviewing his blood glucose logs , discussing his hypoglycemia and hyperglycemia episodes, reviewing his current and  previous labs / studies  ( including abstraction from other facilities) and medications  doses and developing a  long term treatment plan and documenting his care.   Please refer to Patient Instructions for Blood Glucose Monitoring and Insulin/Medications Dosing Guide"  in media tab for additional information. Please  also refer to " Patient Self Inventory" in the Media  tab for reviewed elements of pertinent patient history.  Barry Richfield Sr Megan Turner participated in the discussions, expressed understanding, and voiced agreement with the above plans.  All questions were answered to his satisfaction. he is encouraged to contact clinic should he have any questions or concerns prior to his return visit.    Follow up plan: - Return in about 4 months (around 08/24/2019) for Bring Meter and Logs- A1c in Office, Follow up with Pre-visit Labs.  Glade Lloyd, MD Phone:  [phone]  Fax: [phone]  -  This note was partially dictated with voice recognition software. Similar sounding words can be transcribed inadequately or may not  be corrected upon review.   04/26/2019, 2:57 PM

## 2019-06-07 ENCOUNTER — Other Ambulatory Visit: Payer: Self-pay | Admitting: "Endocrinology

## 2019-06-07 DIAGNOSIS — E1159 Type 2 diabetes mellitus with other circulatory complications: Secondary | ICD-10-CM

## 2019-07-09 ENCOUNTER — Other Ambulatory Visit: Payer: Self-pay | Admitting: "Endocrinology

## 2019-07-09 DIAGNOSIS — E1159 Type 2 diabetes mellitus with other circulatory complications: Secondary | ICD-10-CM

## 2019-07-15 ENCOUNTER — Other Ambulatory Visit: Payer: Self-pay | Admitting: "Endocrinology

## 2019-08-12 ENCOUNTER — Other Ambulatory Visit: Payer: Self-pay | Admitting: "Endocrinology

## 2019-08-12 DIAGNOSIS — E1159 Type 2 diabetes mellitus with other circulatory complications: Secondary | ICD-10-CM

## 2019-08-24 LAB — BASIC METABOLIC PANEL
BUN: 17 (ref 4–21)
Creatinine: 0.9 (ref 0.6–1.3)

## 2019-08-24 LAB — TSH: TSH: 0.53 (ref 0.41–5.90)

## 2019-08-24 LAB — VITAMIN D 25 HYDROXY (VIT D DEFICIENCY, FRACTURES): Vit D, 25-Hydroxy: 27

## 2019-08-24 LAB — LIPID PANEL
Cholesterol: 143 (ref 0–200)
HDL: 39 (ref 35–70)
LDL Cholesterol: 84
Triglycerides: 98 (ref 40–160)

## 2019-08-25 ENCOUNTER — Ambulatory Visit: Payer: Medicare Other | Admitting: "Endocrinology

## 2019-08-25 ENCOUNTER — Encounter: Payer: Self-pay | Admitting: "Endocrinology

## 2019-08-25 ENCOUNTER — Other Ambulatory Visit: Payer: Self-pay

## 2019-08-25 VITALS — BP 166/70 | HR 57 | Ht 72.0 in | Wt 210.6 lb

## 2019-08-25 DIAGNOSIS — E1159 Type 2 diabetes mellitus with other circulatory complications: Secondary | ICD-10-CM

## 2019-08-25 LAB — POCT GLYCOSYLATED HEMOGLOBIN (HGB A1C): Hemoglobin A1C: 8.3 % — AB (ref 4.0–5.6)

## 2019-08-25 MED ORDER — LEVOTHYROXINE SODIUM 50 MCG PO TABS: 50.0000 ug | ORAL_TABLET | Freq: Every day | ORAL | 1 refills | Status: DC

## 2019-08-25 MED ORDER — NOVOLOG FLEXPEN 100 UNIT/ML ~~LOC~~ SOPN: PEN_INJECTOR | SUBCUTANEOUS | 0 refills | Status: DC

## 2019-08-25 MED ORDER — TRESIBA FLEXTOUCH 100 UNIT/ML ~~LOC~~ SOPN: PEN_INJECTOR | SUBCUTANEOUS | 1 refills | Status: DC

## 2019-08-25 MED ORDER — GLIPIZIDE ER 2.5 MG PO TB24: 2.5000 mg | ORAL_TABLET | Freq: Every day | ORAL | 1 refills | Status: DC

## 2019-08-25 NOTE — Patient Instructions (Signed)

## 2019-08-25 NOTE — Progress Notes (Signed)
08/25/2019   Endocrinology follow-up note    Subjective:    Patient ID: Barry Turner, male    DOB: 1940-09-03. Patient is being seen in  follow up for management of type 2  Diabetes, hypothyroidism, hyperlipidemia, hypertension. PMD:  Quentin Cornwall, MD  Past Medical History:  Diagnosis Date  . CHF (congestive heart failure) ([place])   . COPD (chronic obstructive pulmonary disease) ([place])   . Coronary artery disease   . Diabetes mellitus, type II ([place])   . Dysthymic disorder   . Hyperlipidemia   . Hypothyroidism    Past Surgical History:  Procedure Laterality Date  . CHOLECYSTECTOMY    . CORONARY ARTERY BYPASS GRAFT    . MEDIASTINOSCOPY     Social History   Socioeconomic History  . Marital status: Married    Spouse name: Not on file  . Number of children: Not on file  . Years of education: Not on file  . Highest education level: Not on file  Occupational History  . Not on file  Tobacco Use  . Smoking status: Never Smoker  . Smokeless tobacco: Never Used  Substance and Sexual Activity  . Alcohol use: No  . Drug use: No  . Sexual activity: Not on file  Other Topics Concern  . Not on file  Social History Narrative  . Not on file   Social Determinants of Health   Financial Resource Strain:   . Difficulty of Paying Living Expenses:   Food Insecurity:   . Worried About Charity fundraiser in the Last Year:   . Arboriculturist in the Last Year:   Transportation Needs:   . Film/video editor (Medical):   Marland Kitchen Lack of Transportation (Non-Medical):   Physical Activity:   . Days of Exercise per Week:   . Minutes of Exercise per Session:   Stress:   . Feeling of Stress :   Social Connections:   . Frequency of Communication with Friends and Family:   . Frequency of Social Gatherings with Friends and Family:   . Attends Religious Services:   . Active Member of Clubs or Organizations:   . Attends Archivist Meetings:   Marland Kitchen Marital Status:     Outpatient Encounter Medications as of 08/25/2019  Medication Sig  . apixaban (ELIQUIS) 5 MG TABS tablet Take 5 mg by mouth 2 (two) times daily.  Marland Kitchen aspirin EC 81 MG tablet Take 81 mg by mouth daily.  . carvedilol (COREG) 12.5 MG tablet Take 12.5 mg by mouth 2 (two) times daily with a meal.  . Continuous Blood Gluc Sensor (FREESTYLE LIBRE 14 DAY SENSOR) MISC 1 each by Does not apply route every 14 (fourteen) days.  . Continuous Blood Gluc Sensor (FREESTYLE LIBRE SENSOR SYSTEM) MISC Use one sensor every 10 days.  Marland Kitchen esomeprazole (NEXIUM) 40 MG capsule Take 40 mg by mouth daily at 12 noon.  . Fluticasone-Salmeterol (ADVAIR) 250-50 MCG/DOSE AEPB Inhale 1 puff into the lungs 2 (two) times daily as needed.  . furosemide (LASIX) 20 MG tablet Take 20 mg by mouth 2 (two) times daily.  Marland Kitchen glipiZIDE (GLUCOTROL XL) 2.5 MG 24 hr tablet Take 1 tablet (2.5 mg total) by mouth daily with breakfast.  . GLOBAL EASE INJECT PEN NEEDLES 31G X 8 MM MISC USE AS DIRECTED  . glucagon (GLUCAGON EMERGENCY) 1 MG injection Inject 1 mg into the vein once as needed.  Marland Kitchen glucose blood (CONTOUR NEXT TEST) test strip Use to  test blood glucose 4 times a day. E11.65 (Patient not taking: Reported on 10/23/2017)  . ibuprofen (ADVIL,MOTRIN) 800 MG tablet Take 800 mg by mouth every 8 (eight) hours as needed.  . insulin aspart (NOVOLOG FLEXPEN) 100 UNIT/ML FlexPen As directed only if premeal glucose is above 150  . insulin degludec (TRESIBA FLEXTOUCH) 100 UNIT/ML FlexTouch Pen INJECT 50 UNITS TOTAL (0.5 MLS) INTO THE SKIN DAILY AT BEDTIME  . ipratropium-albuterol (DUONEB) 0.5-2.5 (3) MG/3ML SOLN Take 3 mLs by nebulization as directed.  . isosorbide dinitrate (ISORDIL) 30 MG tablet Take 30 mg by mouth daily.  Marland Kitchen levothyroxine (SYNTHROID) 50 MCG tablet Take 1 tablet (50 mcg total) by mouth daily before breakfast.  . LORazepam (ATIVAN) 0.5 MG tablet Take 0.5 mg by mouth at bedtime.  Marland Kitchen losartan (COZAAR) 100 MG tablet Take 100 mg by mouth daily.   . meclizine (ANTIVERT) 25 MG tablet Take 25 mg by mouth 2 (two) times daily as needed for dizziness.  . Microlet Lancets MISC Use 4 times a day to test blood glucose.  . nitroGLYCERIN (NITROSTAT) 0.4 MG SL tablet Place 0.4 mg under the tongue every 5 (five) minutes as needed for chest pain.  . potassium chloride (K-DUR) 10 MEQ tablet Take 10 mEq by mouth daily.  . predniSONE (DELTASONE) 10 MG tablet Take 10 mg by mouth daily with breakfast.  . ranitidine (ZANTAC) 150 MG capsule Take 150 mg by mouth 2 (two) times daily.  . sotalol (BETAPACE) 80 MG tablet Take 80 mg by mouth 2 (two) times daily.  . TERBINAFINE HCL PO Take by mouth.  . tiotropium (SPIRIVA) 18 MCG inhalation capsule Place 18 mcg into inhaler and inhale daily.  . [DISCONTINUED] glipiZIDE (GLUCOTROL XL) 5 MG 24 hr tablet TAKE 1 TABLET BY MOUTH ONCE A DAY WITH BREAKFAST  . [DISCONTINUED] insulin aspart (NOVOLOG FLEXPEN) 100 UNIT/ML FlexPen Inject 4 Units into the skin 3 (three) times daily with meals.  . [DISCONTINUED] levothyroxine (SYNTHROID, LEVOTHROID) 50 MCG tablet Take 50 mcg by mouth daily before breakfast.  . [DISCONTINUED] TRESIBA FLEXTOUCH 100 UNIT/ML FlexTouch Pen INJECT 50 UNITS TOTAL (0.5 MLS) INTO THE SKIN DAILY AT BEDTIME   No facility-administered encounter medications on file as of 08/25/2019.   ALLERGIES: Allergies  Allergen Reactions  . Lac Bovis Nausea Only  . Plavix [Clopidogrel]   . Sucralfate    VACCINATION STATUS: Immunization History  Administered Date(s) Administered  . Influenza-Unspecified 12/28/2011  . Pneumococcal Conjugate-13 11/08/2013    Diabetes He presents for his follow-up diabetic visit. He has type 2 diabetes mellitus. Onset time: He was diagnosed at approximate age of 79 years. His disease course has been fluctuating. There are no hypoglycemic associated symptoms. Pertinent negatives for hypoglycemia include no confusion, headaches, pallor or seizures. There are no diabetic associated  symptoms. Pertinent negatives for diabetes include no chest pain, no fatigue, no polydipsia, no polyphagia, no polyuria and no weakness. There are no hypoglycemic complications. Symptoms are improving. Diabetic complications include a CVA and peripheral neuropathy. Risk factors for coronary artery disease include diabetes mellitus, hypertension, obesity and sedentary lifestyle. His weight is increasing steadily. He is following a generally unhealthy diet. When asked about meal planning, he reported none. He has not had a previous visit with a dietitian. He participates in exercise intermittently. His home blood glucose trend is fluctuating dramatically. His breakfast blood glucose range is generally >200 mg/dl. His lunch blood glucose range is generally >200 mg/dl. His dinner blood glucose range is generally 180-200 mg/dl. His  bedtime blood glucose range is generally >200 mg/dl. His overall blood glucose range is >200 mg/dl. (His presents with his freestyle libre device and logs.  Analysis shows he has 43% time range, 54% above range.  Average blood glucose of 194, point-of-care A1c today is 8.3% improving from 9.2%.  He does have 2% hypoglycemia    ) An ACE inhibitor/angiotensin II receptor blocker is being taken.  Hyperlipidemia This is a chronic problem. The current episode started more than 1 year ago. The problem is uncontrolled. Exacerbating diseases include diabetes and hypothyroidism. Pertinent negatives include no chest pain, myalgias or shortness of breath. He is currently on no antihyperlipidemic treatment. Risk factors for coronary artery disease include diabetes mellitus, dyslipidemia, hypertension, a sedentary lifestyle, male sex and obesity.  Hypertension This is a chronic problem. The current episode started more than 1 year ago. Pertinent negatives include no chest pain, headaches, neck pain, palpitations or shortness of breath. Risk factors for coronary artery disease include diabetes  mellitus, dyslipidemia and male gender. Past treatments include angiotensin blockers. Hypertensive end-organ damage includes CVA.     Review of systems  Constitutional: + lost weight,  current  Body mass index is 28.56 kg/m. , no fatigue, no subjective hyperthermia, no subjective hypothermia Eyes: no blurry vision, no xerophthalmia ENT: no sore throat, no nodules palpated in throat, no dysphagia/odynophagia, no hoarseness Cardiovascular: no Chest Pain, no Shortness of Breath, no palpitations, no leg swelling Respiratory: no cough, no shortness of breath Gastrointestinal: no Nausea/Vomiting/Diarhhea Musculoskeletal: no muscle/joint aches Skin: no rashes Neurological: no tremors, no numbness, no tingling, no dizziness Psychiatric: no depression, no anxiety    Objective:    BP (!) 166/70   Pulse (!) 57   Ht 6' (1.829 m)   Wt 210 lb 9.6 oz (95.5 kg)   BMI 28.56 kg/m   Wt Readings from Last 3 Encounters:  08/25/19 210 lb 9.6 oz (95.5 kg)  04/26/19 220 lb 6.4 oz (100 kg)  09/03/18 214 lb (97.1 kg)     Physical Exam- Limited  Constitutional:  Body mass index is 28.56 kg/m. , not in acute distress, normal state of mind Eyes:  EOMI, no exophthalmos Neck: Supple Respiratory: Adequate breathing efforts Musculoskeletal: no gross deformities, strength intact in all four extremities, no gross restriction of joint movements, + bilateral pedal , pretibial edema. Skin:  no rashes, no hyperemia Neurological: no tremor with outstretched hands.  Diabetic Labs (most recent): Lab Results  Component Value Date   HGBA1C 8.3 (A) 08/25/2019   HGBA1C 9.2 (A) 04/26/2019   HGBA1C 9.3 12/24/2018   CMP Latest Ref Rng & Units 08/24/2019 12/24/2018 05/25/2018  BUN 4 - '21 17 21 ' 23(A)  Creatinine 0.6 - 1.3 0.9 1.2 1.2     Lipid Panel     Component Value Date/Time   CHOL 143 08/24/2019 0000   TRIG 98 08/24/2019 0000   HDL 39 08/24/2019 0000   LDLCALC 84 08/24/2019 0000    Assessment & Plan:    1. DM type 2 causing vascular disease ([place])  - Patient has currently uncontrolled symptomatic type 2 DM since 79 years of age. - He reports significantly above target glycemic profile, however encouraged by the fact that he did not have hypoglycemia anymore.    His presents with his freestyle libre device and logs.  Analysis shows he has 43% time range, 54% above range.  Average blood glucose of 194, point-of-care A1c today is 8.3% improving from 9.2%.  He does have 2% hypoglycemia.    -  Patient has had bad experience with hypoglycemia, and trying to avoid hypoglycemia as much as he can.  He worries about hypoglycemia.   - his diabetes is complicated by CVA and patient remains at a high risk for more acute and chronic complications of diabetes which include CAD, CVA, CKD, retinopathy, and neuropathy. These are all discussed in detail with the patient.  - I have counseled the patient on diet management and weight loss, by adopting a carbohydrate restricted/protein rich diet.  - he  admits there is a room for improvement in his diet and drink choices. -  Suggestion is made for him to avoid simple carbohydrates  from his diet including Cakes, Sweet Desserts / Pastries, Ice Cream, Soda (diet and regular), Sweet Tea, Candies, Chips, Cookies, Sweet Pastries,  Store Bought Juices, Alcohol in Excess of  1-2 drinks a day, Artificial Sweeteners, Coffee Creamer, and "Sugar-free" Products. This will help patient to have stable blood glucose profile and potentially avoid unintended weight gain.   - I encouraged the patient to switch to  unprocessed or minimally processed complex starch and increased protein intake (animal or plant source), fruits, and vegetables.  - Patient is advised to stick to a routine mealtimes to eat 3 meals  a day and avoid unnecessary snacks ( to snack only to correct hypoglycemia).   - I have approached patient with the following individualized plan to manage diabetes and  patient agrees:   -He is still taking prednisone 10 mg p.o. every day for chronic COPD.   I advised him to avoid sudden stopping of prednisone to prevent adrenal crisis.  -This patient will continue to require intensive treatment with basal/bolus insulin in order for him to achieve and maintain control of diabetes to target.    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 -He has had benefited tremendously from his CGM device, he is encouraged to use it at all times.      -He is advised to continue Tresiba 50 units nightly, use NovoLog only if Premeal blood glucose readings are above 150 mg/dl, associated with strict documenting of glucose   4 times a day-before meals and at bedtime. - Patient is warned not to take insulin without proper monitoring per orders. -Adjustment parameters are given for hypo and hyperglycemia in writing. -Patient is encouraged to call clinic for blood glucose levels less than 70 or above 300 mg /dl.  - Reportedly, he does not tolerate metformin, tried DPPIV  inhibitors before, and like to avoid those medications for now. -He is advised to lower glipizide to 2.5 mg XL daily at breakfast.  - I have prescribed glucagon emergency kit. - His renal function continues to be normal.   - Patient specific target  A1c;  LDL, HDL, Triglycerides, and  Waist Circumference were discussed in detail.  2) BP/HTN:  His blood pressure is not controlled to target.  He is advised to continue his current blood pressure medications including losartan 100 mg p.o. daily, Lasix as needed, carvedilol 12.5 mg p.o. twice daily.    3) Lipids/HPL: His recent lipid panel showed improved LDL at 84, improving from 107.   He is not on statin.  He wishes to avoid any further addition of medication at this time.  He will be considered for repeat fasting lipid panel on subsequent visits.     4)  Weight/Diet: CDE Consult has been  initiated , exercise, and detailed carbohydrates information provided.  5) hypothyroidism: -His previsit labs for thyroid function tests are consistent with appropriate replacement.  He is advised to continue   levothyroxine 50 mcg p.o. every morning.    - We discussed about the correct intake of his thyroid hormone, on empty stomach at fasting, with water, separated by at least 30 minutes from breakfast and other medications,  and separated by more than 4 hours from calcium, iron, multivitamins, acid reflux medications (PPIs). -Patient is made aware of the fact that thyroid hormone replacement is needed for life, dose to be adjusted by periodic monitoring of thyroid function tests.   6) Chronic Care/Health Maintenance:  -Patient is on ARB and encouraged to continue to follow up with Ophthalmology, Podiatrist at least yearly or according to recommendations, and advised to   stay away from smoking. I have recommended yearly flu vaccine and pneumonia vaccination at least every 5 years;  and  sleep for at least 7 hours a day.   - I advised patient to maintain close follow up with Quentin Cornwall, MD for primary care needs.   - Time spent on this patient care encounter:  35 min, of which > 50% was spent in  counseling and the rest reviewing his blood glucose logs , discussing his hypoglycemia and hyperglycemia episodes, reviewing his current and  previous labs / studies  ( including abstraction from other facilities) and medications  doses and developing a  long term treatment plan and documenting his care.   Please refer to Patient Instructions for Blood Glucose Monitoring and Insulin/Medications Dosing Guide"  in media tab for additional information. Please  also refer to " Patient Self Inventory" in the Media  tab for reviewed elements of pertinent patient history.  Izell Lake Buckhorn Sr Megan Salon participated in the discussions, expressed understanding, and voiced  agreement with the above plans.  All questions were answered to his satisfaction. he is encouraged to contact clinic should he have any questions or concerns prior to his return visit.    Follow up plan: - Return in about 3 months (around 11/25/2019) for F/U with Pre-visit Labs, Meter, Logs, A1c here., NV Office Urine MA, NV A1c in Office.  Glade Lloyd, MD Phone: [phone]  Fax: [phone]  -  This note was partially dictated with voice recognition software. Similar sounding words can be transcribed inadequately or may not  be corrected upon review.   08/25/2019, 2:57 PM

## 2019-09-27 ENCOUNTER — Other Ambulatory Visit: Payer: Self-pay | Admitting: "Endocrinology

## 2019-12-01 ENCOUNTER — Ambulatory Visit: Payer: Medicare Other | Admitting: "Endocrinology

## 2019-12-08 ENCOUNTER — Ambulatory Visit: Payer: Medicare Other | Admitting: Nurse Practitioner

## 2019-12-13 ENCOUNTER — Other Ambulatory Visit: Payer: Self-pay | Admitting: "Endocrinology

## 2019-12-13 DIAGNOSIS — E1159 Type 2 diabetes mellitus with other circulatory complications: Secondary | ICD-10-CM

## 2019-12-29 LAB — COMPREHENSIVE METABOLIC PANEL
Calcium: 9.1 (ref 8.7–10.7)
GFR calc Af Amer: >60
GFR calc non Af Amer: >60

## 2019-12-29 LAB — LIPID PANEL
Cholesterol: 152 (ref 0–200)
HDL: 39 (ref 35–70)
LDL Cholesterol: 80
Triglycerides: 167 — AB (ref 40–160)

## 2019-12-29 LAB — BASIC METABOLIC PANEL
BUN: 22 — AB (ref 4–21)
Creatinine: 1.1 (ref 0.6–1.3)

## 2019-12-29 LAB — TSH: TSH: 0.57 (ref 0.41–5.90)

## 2020-01-11 ENCOUNTER — Encounter: Payer: Self-pay | Admitting: Nurse Practitioner

## 2020-01-11 ENCOUNTER — Ambulatory Visit (INDEPENDENT_AMBULATORY_CARE_PROVIDER_SITE_OTHER): Payer: Medicare Other | Admitting: Nurse Practitioner

## 2020-01-11 ENCOUNTER — Other Ambulatory Visit: Payer: Self-pay

## 2020-01-11 VITALS — BP 129/66 | HR 62 | Ht 72.0 in | Wt 213.0 lb

## 2020-01-11 DIAGNOSIS — E1159 Type 2 diabetes mellitus with other circulatory complications: Secondary | ICD-10-CM | POA: Diagnosis not present

## 2020-01-11 DIAGNOSIS — I10 Essential (primary) hypertension: Secondary | ICD-10-CM | POA: Diagnosis not present

## 2020-01-11 DIAGNOSIS — E039 Hypothyroidism, unspecified: Secondary | ICD-10-CM

## 2020-01-11 DIAGNOSIS — E782 Mixed hyperlipidemia: Secondary | ICD-10-CM

## 2020-01-11 DIAGNOSIS — E559 Vitamin D deficiency, unspecified: Secondary | ICD-10-CM

## 2020-01-11 LAB — POCT GLYCOSYLATED HEMOGLOBIN (HGB A1C): Hemoglobin A1C: 8.7 % — AB (ref 4.0–5.6)

## 2020-01-11 NOTE — Patient Instructions (Signed)

## 2020-01-11 NOTE — Progress Notes (Signed)
01/11/2020   Endocrinology follow-up note    Subjective:    Patient ID: Barry Turner, male    DOB: [birth year]. Patient is being seen in  follow up for management of type 2  Diabetes, hypothyroidism, hyperlipidemia, hypertension. PMD:  Quentin Cornwall, MD  Past Medical History:  Diagnosis Date   CHF (congestive heart failure) (HCC)    COPD (chronic obstructive pulmonary disease) ([place])    Coronary artery disease    Diabetes mellitus, type II ([place])    Dysthymic disorder    Hyperlipidemia    Hypothyroidism    Past Surgical History:  Procedure Laterality Date   CHOLECYSTECTOMY     CORONARY ARTERY BYPASS GRAFT     MEDIASTINOSCOPY     Social History   Socioeconomic History   Marital status: Married    Spouse name: Not on file   Number of children: Not on file   Years of education: Not on file   Highest education level: Not on file  Occupational History   Not on file  Tobacco Use   Smoking status: Never Smoker   Smokeless tobacco: Never Used  Vaping Use   Vaping Use: Never used  Substance and Sexual Activity   Alcohol use: No   Drug use: No   Sexual activity: Not on file  Other Topics Concern   Not on file  Social History Narrative   Not on file   Social Determinants of Health   Financial Resource Strain:    Difficulty of Paying Living Expenses: Not on file  Food Insecurity:    Worried About Running Out of Food in the Last Year: Not on file   Ran Out of Food in the Last Year: Not on file  Transportation Needs:    Lack of Transportation (Medical): Not on file   Lack of Transportation (Non-Medical): Not on file  Physical Activity:    Days of Exercise per Week: Not on file   Minutes of Exercise per Session: Not on file  Stress:    Feeling of Stress : Not on file  Social Connections:    Frequency of Communication with Friends and Family: Not on file   Frequency of Social Gatherings with Friends and Family: Not on file    Attends Religious Services: Not on file   Active Member of Clubs or Organizations: Not on file   Attends Club or Organization Meetings: Not on file   Marital Status: Not on file   Outpatient Encounter Medications as of 01/11/2020  Medication Sig   albuterol (PROVENTIL) (2.5 MG/3ML) 0.083% nebulizer solution USE 1 VIAL VIA NEBULIZER FOUR TIMES DAILY   albuterol (VENTOLIN HFA) 108 (90 Base) MCG/ACT inhaler Inhale into the lungs.   apixaban (ELIQUIS) 5 MG TABS tablet Take 5 mg by mouth 2 (two) times daily.   aspirin EC 81 MG tablet Take 81 mg by mouth daily.   Continuous Blood Gluc Sensor (FREESTYLE LIBRE 14 DAY SENSOR) MISC 1 each by Does not apply route every 14 (fourteen) days.   Continuous Blood Gluc Sensor (FREESTYLE LIBRE SENSOR SYSTEM) MISC Use one sensor every 10 days.   esomeprazole (NEXIUM) 40 MG capsule Take 40 mg by mouth daily at 12 noon.   Fluticasone-Salmeterol (ADVAIR) 250-50 MCG/DOSE AEPB Inhale 1 puff into the lungs 2 (two) times daily as needed.   furosemide (LASIX) 20 MG tablet Take 80 mg by mouth daily.    GLOBAL EASE INJECT PEN NEEDLES 31G X 8 MM MISC USE AS DIRECTED  glucagon (GLUCAGON EMERGENCY) 1 MG injection Inject 1 mg into the vein once as needed.   glucose blood (CONTOUR NEXT TEST) test strip Use to test blood glucose 4 times a day. E11.65   ibuprofen (ADVIL,MOTRIN) 800 MG tablet Take 800 mg by mouth every 8 (eight) hours as needed.   ipratropium (ATROVENT) 0.02 % nebulizer solution USE 1 VIAL VIA NEBULIZER FOUR TIMES DAILY AS DIRECTED   ipratropium-albuterol (DUONEB) 0.5-2.5 (3) MG/3ML SOLN Take 3 mLs by nebulization as directed.   isosorbide dinitrate (ISORDIL) 30 MG tablet Take 30 mg by mouth daily.   levothyroxine (SYNTHROID) 50 MCG tablet Take 1 tablet (50 mcg total) by mouth daily before breakfast.   LORazepam (ATIVAN) 0.5 MG tablet Take 0.5 mg by mouth at bedtime.   losartan (COZAAR) 100 MG tablet Take 100 mg by mouth daily.    meclizine (ANTIVERT) 25 MG tablet Take 25 mg by mouth 2 (two) times daily as needed for dizziness.   Microlet Lancets MISC Use 4 times a day to test blood glucose.   nitroGLYCERIN (NITROSTAT) 0.4 MG SL tablet Place 0.4 mg under the tongue every 5 (five) minutes as needed for chest pain.   potassium chloride (K-DUR) 10 MEQ tablet Take 10 mEq by mouth daily.   predniSONE (DELTASONE) 10 MG tablet Take 10 mg by mouth daily with breakfast.   ranitidine (ZANTAC) 150 MG capsule Take 150 mg by mouth 2 (two) times daily.   sotalol (BETAPACE) 80 MG tablet Take 80 mg by mouth 2 (two) times daily.   TRESIBA FLEXTOUCH 100 UNIT/ML FlexTouch Pen INJECT 50 UNITS TOTAL (0.5 MLS) INTO THE SKIN DAILY AT BEDTIME   [DISCONTINUED] glipiZIDE (GLUCOTROL XL) 2.5 MG 24 hr tablet Take 1 tablet (2.5 mg total) by mouth daily with breakfast.   insulin aspart (NOVOLOG FLEXPEN) 100 UNIT/ML FlexPen As directed only if premeal glucose is above 150 (Patient not taking: Reported on 01/11/2020)   TERBINAFINE HCL PO Take by mouth. (Patient not taking: Reported on 01/11/2020)   tiotropium (SPIRIVA) 18 MCG inhalation capsule Place 18 mcg into inhaler and inhale daily. (Patient not taking: Reported on 01/11/2020)   [DISCONTINUED] albuterol (PROVENTIL) (2.5 MG/3ML) 0.083% nebulizer solution Take 2.5 mg by nebulization 4 (four) times daily.   [DISCONTINUED] carvedilol (COREG) 12.5 MG tablet Take 12.5 mg by mouth 2 (two) times daily with a meal.   No facility-administered encounter medications on file as of 01/11/2020.   ALLERGIES: Allergies  Allergen Reactions   Lac Bovis Nausea Only   Plavix [Clopidogrel]    Sucralfate    VACCINATION STATUS: Immunization History  Administered Date(s) Administered   Influenza-Unspecified 12/28/2011   Pneumococcal Conjugate-13 11/08/2013    Diabetes He presents for his follow-up diabetic visit. He has type 2 diabetes mellitus. Onset time: He was diagnosed at approximate age of  79 years. His disease course has been stable. There are no hypoglycemic associated symptoms. Pertinent negatives for hypoglycemia include no confusion, headaches, pallor or seizures. There are no diabetic associated symptoms. Pertinent negatives for diabetes include no chest pain, no fatigue, no polydipsia, no polyphagia, no polyuria and no weakness. There are no hypoglycemic complications. Symptoms are stable. Diabetic complications include a CVA and peripheral neuropathy. Risk factors for coronary artery disease include diabetes mellitus, hypertension, obesity, sedentary lifestyle, male sex and dyslipidemia. Current diabetic treatment includes intensive insulin program and oral agent (monotherapy). He is compliant with treatment most of the time. His weight is fluctuating minimally. He is following a diabetic (his wife limits his  carb intake) diet. When asked about meal planning, he reported none. He has not had a previous visit with a dietitian. He rarely participates in exercise. His home blood glucose trend is fluctuating minimally. His breakfast blood glucose range is generally >200 mg/dl. His lunch blood glucose range is generally >200 mg/dl. His dinner blood glucose range is generally 70-90 mg/dl. His bedtime blood glucose range is generally >200 mg/dl. His overall blood glucose range is >200 mg/dl. (He presents today with his CGM and logs showing fluctuating glycemic profile with higher fasting glucose and drops in postprandial glycemic profile.  Analysis of his CGM shows 90 day average TIR 34%, TAR 62%, and TBR 4%.  His lows tend to happen in early afternoon.  His POCT A1C today is 8.7%, slightly worse from last visit of 8.3%.  He has stopped taking his short-acting insulin for approximately 3 months due to hypoglycemia.  His meal volumes fluctuate depending on his respiratory status, thus inadequate carb intake may be contributing to his postprandial hypoglycemia.) An ACE inhibitor/angiotensin II receptor  blocker is being taken. He does not see a podiatrist.Eye exam is current.  Hyperlipidemia This is a chronic problem. The current episode started more than 1 year ago. The problem is controlled. Recent lipid tests were reviewed and are variable. Exacerbating diseases include chronic renal disease, diabetes and hypothyroidism. Factors aggravating his hyperlipidemia include beta blockers and fatty foods. Pertinent negatives include no chest pain, myalgias or shortness of breath. He is currently on no antihyperlipidemic treatment. Compliance problems include adherence to diet and adherence to exercise.  Risk factors for coronary artery disease include diabetes mellitus, dyslipidemia, hypertension, a sedentary lifestyle, male sex and obesity.  Hypertension This is a chronic problem. The current episode started more than 1 year ago. The problem is unchanged. The problem is controlled. Pertinent negatives include no chest pain, headaches, neck pain, palpitations or shortness of breath. Agents associated with hypertension include thyroid hormones and steroids. Risk factors for coronary artery disease include diabetes mellitus, dyslipidemia, male gender and sedentary lifestyle. Past treatments include angiotensin blockers, diuretics and beta blockers. The current treatment provides moderate improvement. Compliance problems include diet and exercise.  Hypertensive end-organ damage includes CAD/MI, CVA and heart failure. Identifiable causes of hypertension include chronic renal disease and a thyroid problem.    Review of systems  Constitutional: + Minimally fluctuating body weight,  current Body mass index is 28.89 kg/m. , + fatigue, no subjective hyperthermia, no subjective hypothermia Eyes: no blurry vision, no xerophthalmia ENT: no sore throat, no nodules palpated in throat, no dysphagia/odynophagia, no hoarseness Cardiovascular: no chest pain, no palpitations, + leg swelling (being seen by cardiology for  this) Respiratory: + chronic cough, +chronic shortness of breath, on home oxygen (R/t COPD) Gastrointestinal: no nausea/vomiting/diarrhea Musculoskeletal: no muscle/joint aches Skin: no rashes, no hyperemia Neurological: no tremors, no numbness, no tingling, no dizziness Psychiatric: no depression, no anxiety    Objective:    BP 129/66 (BP Location: Right Arm)    Pulse 62    Ht 6' (1.829 m)    Wt 213 lb (96.6 kg)    BMI 28.89 kg/m   Wt Readings from Last 3 Encounters:  01/11/20 213 lb (96.6 kg)  08/25/19 210 lb 9.6 oz (95.5 kg)  04/26/19 220 lb 6.4 oz (100 kg)    BP Readings from Last 3 Encounters:  01/11/20 129/66  08/25/19 (!) 166/70  04/26/19 (!) 168/76    Physical Exam- Limited  Constitutional:  Body mass index is 28.89  kg/m. , not in acute distress, normal state of mind Eyes:  EOMI, no exophthalmos Neck: Supple Thyroid: No gross goiter Cardiovascular: RRR, no murmers, rubs, or gallops, + Bilateral LE edema  Respiratory: + chronic cough, +chronic shortness of breath, on home oxygen, dyspnea at rest, + crackles, rhonchi, and inspiratory/expiratory wheezing noted Musculoskeletal: no gross deformities, strength intact in all four extremities, no gross restriction of joint movements, significantly deconditioned- is essentially bed bound due to respiratory decline, can walk short distances. Skin:  no rashes, no hyperemia Neurological: no tremor with outstretched hands    Diabetic Labs (most recent): Lab Results  Component Value Date   HGBA1C 8.7 (A) 01/11/2020   HGBA1C 8.3 (A) 08/25/2019   HGBA1C 9.2 (A) 04/26/2019   CMP Latest Ref Rng & Units 12/29/2019 08/24/2019 12/24/2018  BUN 4 - 21 22(A) 17 21  Creatinine 0.6 - 1.3 1.1 0.9 1.2  Calcium 8.7 - 10.7 9.1 - -     Lipid Panel     Component Value Date/Time   CHOL 152 12/29/2019 0000   TRIG 167 (A) 12/29/2019 0000   HDL 39 12/29/2019 0000   LDLCALC 80 12/29/2019 0000    Assessment & Plan:   1. DM type 2  causing vascular disease ([place])  - Patient has currently uncontrolled symptomatic type 2 DM since 79 years of age.  He presents today with his CGM and logs showing fluctuating glycemic profile with higher fasting glucose and drops in postprandial glycemic profile.  Analysis of his CGM shows 90 day average TIR 34%, TAR 62%, and TBR 4%.  His lows tend to happen in early afternoon.  His POCT A1C today is 8.7%, slightly worse from last visit of 8.3%.  He has stopped taking his short-acting insulin for approximately 3 months due to hypoglycemia.  His meal volumes fluctuate depending on his respiratory status, thus inadequate carb intake may be contributing to his postprandial hypoglycemia.   - his diabetes is complicated by CVA and patient remains at a high risk for more acute and chronic complications of diabetes which include CAD, CVA, CKD, retinopathy, and neuropathy. These are all discussed in detail with the patient.  - Nutritional counseling repeated at each appointment due to patients tendency to fall back in to old habits.  - The patient admits there is a room for improvement in their diet and drink choices. -  Suggestion is made for the patient to avoid simple carbohydrates from their diet including Cakes, Sweet Desserts / Pastries, Ice Cream, Soda (diet and regular), Sweet Tea, Candies, Chips, Cookies, Sweet Pastries,  Store Bought Juices, Alcohol in Excess of  1-2 drinks a day, Artificial Sweeteners, Coffee Creamer, and "Sugar-free" Products. This will help patient to have stable blood glucose profile and potentially avoid unintended weight gain.   - I encouraged the patient to switch to  unprocessed or minimally processed complex starch and increased protein intake (animal or plant source), fruits, and vegetables.   - Patient is advised to stick to a routine mealtimes to eat 3 meals  a day and avoid unnecessary snacks ( to snack only to correct hypoglycemia).  - I have approached patient with  the following individualized plan to manage diabetes and patient agrees:   -He is still taking prednisone 10 mg p.o. every day for chronic COPD.  I advised him to avoid sudden stopping of prednisone to prevent adrenal crisis.   -This patient will continue to require intensive treatment with basal/bolus insulin in order for him to  achieve and maintain control of diabetes to target.   -The main goal of therapy in this patient would be avoiding hypoglycemia due to advanced age, as well as his unexplained seizure attacks associated with hypoglycemia.   -Given his postprandial hypoglycemia, will discontinue Glipizide 2.5 mg XL for now.  His unpredictable meal intake secondary to his breathing issues makes him a higher risk for hypoglycemia.  -He is advised to continue Tresiba 50 units SQ daily at bedtime.  He is encouraged to restart Novolog insulin 2-7 units TID with meals if glucose is above 150 and he is eating.  Specific instructions on how to titrate insulin dose based on glucose readings given to patient in writing.  -He is encouraged to continue using his CGM to monitor glucose at least 4 times daily, before meals and before bed, and call the clinic if he has readings less than 70 or greater than 300 for 3 tests in a row.  - Reportedly, he does not tolerate metformin, tried DPPIV  inhibitors before, will avoid those medications for now.   - Patient specific target  A1c;  LDL, HDL, Triglycerides, and  Waist Circumference were discussed in detail.  2) BP/HTN:  His blood pressure is controlled to target.  He is advised to continue Lasix 20 mg po twice daily, Isosorbide dinitrate 30 mg po daily, Losartan 100 mg po daily, and Coreg 12.5 mg po twice daily.  Will defer any med changes to nephrology.  3) Lipids/HPL: His most recent lipid panel from 12/29/19 shows controlled LDL at 80 and elevated triglycerides of 167.  He is not on any lipid lowering medication at this time.  He is advised to avoid  fried foods and butter.  4)  Weight/Diet:  His Body mass index is 28.89 kg/m.-- not a candidate for major weight loss.  CDE Consult has been  initiated, exercise, and detailed carbohydrates information provided.  5) Hypothyroidism: -His previsit thyroid function tests are consistent with appropriate hormone replacement.  He is advised to continue Levothyroxine 50 mcg po daily before breakfast.   - We discussed about the correct intake of his thyroid hormone, on empty stomach at fasting, with water, separated by at least 30 minutes from breakfast and other medications,  and separated by more than 4 hours from calcium, iron, multivitamins, acid reflux medications (PPIs). -Patient is made aware of the fact that thyroid hormone replacement is needed for life, dose to be adjusted by periodic monitoring of thyroid function tests.  6) Chronic Care/Health Maintenance: -Patient is on ARB and encouraged to continue to follow up with Ophthalmology, Podiatrist at least yearly or according to recommendations, and advised to   stay away from smoking. I have recommended yearly flu vaccine and pneumonia vaccination at least every 5 years;  and  sleep for at least 7 hours a day.   - I advised patient to maintain close follow up with Quentin Cornwall, MD for primary care needs.   - Time spent on this patient care encounter:  35 min, of which > 50% was spent in  counseling and the rest reviewing his blood glucose logs , discussing his hypoglycemia and hyperglycemia episodes, reviewing his current and  previous labs / studies  ( including abstraction from other facilities) and medications  doses and developing a  long term treatment plan and documenting his care.   Please refer to Patient Instructions for Blood Glucose Monitoring and Insulin/Medications Dosing Guide"  in media tab for additional information. Please  also  refer to " Patient Self Inventory" in the Media  tab for reviewed elements of pertinent patient  history.  Barry Turner participated in the discussions, expressed understanding, and voiced agreement with the above plans.  All questions were answered to his satisfaction. he is encouraged to contact clinic should he have any questions or concerns prior to his return visit.    Follow up plan: - Return in about 3 months (around 04/12/2020) for Diabetes follow up- A1c and urine micro in office, Thyroid follow up, Previsit labs.  Rayetta Pigg, [facility] [address] [place], [place] [address] Phone: [phone] Fax: [phone]   01/11/2020, 2:18 PM

## 2020-01-24 ENCOUNTER — Other Ambulatory Visit: Payer: Self-pay

## 2020-01-24 DIAGNOSIS — E1159 Type 2 diabetes mellitus with other circulatory complications: Secondary | ICD-10-CM

## 2020-01-24 MED ORDER — TRESIBA FLEXTOUCH 100 UNIT/ML ~~LOC~~ SOPN: PEN_INJECTOR | SUBCUTANEOUS | 2 refills | Status: DC

## 2020-02-11 ENCOUNTER — Other Ambulatory Visit: Payer: Self-pay | Admitting: "Endocrinology

## 2020-03-02 ENCOUNTER — Other Ambulatory Visit: Payer: Self-pay | Admitting: "Endocrinology

## 2020-03-06 ENCOUNTER — Other Ambulatory Visit: Payer: Self-pay | Admitting: "Endocrinology

## 2020-03-06 ENCOUNTER — Other Ambulatory Visit: Payer: Self-pay

## 2020-03-06 DIAGNOSIS — E039 Hypothyroidism, unspecified: Secondary | ICD-10-CM

## 2020-03-06 MED ORDER — LEVOTHYROXINE SODIUM 50 MCG PO TABS: 50.0000 ug | ORAL_TABLET | Freq: Every day | ORAL | 1 refills | Status: AC

## 2020-03-14 ENCOUNTER — Encounter: Payer: Self-pay | Admitting: Nurse Practitioner

## 2020-03-14 ENCOUNTER — Ambulatory Visit (INDEPENDENT_AMBULATORY_CARE_PROVIDER_SITE_OTHER): Payer: Medicare Other | Admitting: Nurse Practitioner

## 2020-03-14 ENCOUNTER — Other Ambulatory Visit: Payer: Self-pay

## 2020-03-14 VITALS — BP 161/77 | HR 60 | Ht 72.0 in | Wt 201.4 lb

## 2020-03-14 DIAGNOSIS — E559 Vitamin D deficiency, unspecified: Secondary | ICD-10-CM

## 2020-03-14 DIAGNOSIS — E1159 Type 2 diabetes mellitus with other circulatory complications: Secondary | ICD-10-CM

## 2020-03-14 DIAGNOSIS — E782 Mixed hyperlipidemia: Secondary | ICD-10-CM | POA: Diagnosis not present

## 2020-03-14 DIAGNOSIS — E039 Hypothyroidism, unspecified: Secondary | ICD-10-CM | POA: Diagnosis not present

## 2020-03-14 DIAGNOSIS — I10 Essential (primary) hypertension: Secondary | ICD-10-CM | POA: Diagnosis not present

## 2020-03-14 NOTE — Patient Instructions (Signed)

## 2020-03-14 NOTE — Progress Notes (Signed)
03/14/2020   Endocrinology follow-up note    Subjective:    Patient ID: Barry Turner, male    DOB: Nov 12, 1940. Patient is being seen in  follow up for management of type 2  Diabetes, hypothyroidism, hyperlipidemia, hypertension. PMD:  Quentin Cornwall, MD  Past Medical History:  Diagnosis Date  . CHF (congestive heart failure) ([place])   . COPD (chronic obstructive pulmonary disease) ([place])   . Coronary artery disease   . Diabetes mellitus, type II ([place])   . Dysthymic disorder   . Hyperlipidemia   . Hypothyroidism    Past Surgical History:  Procedure Laterality Date  . CHOLECYSTECTOMY    . CORONARY ARTERY BYPASS GRAFT    . MEDIASTINOSCOPY     Social History   Socioeconomic History  . Marital status: Married    Spouse name: Not on file  . Number of children: Not on file  . Years of education: Not on file  . Highest education level: Not on file  Occupational History  . Not on file  Tobacco Use  . Smoking status: Never Smoker  . Smokeless tobacco: Never Used  Vaping Use  . Vaping Use: Never used  Substance and Sexual Activity  . Alcohol use: No  . Drug use: No  . Sexual activity: Not on file  Other Topics Concern  . Not on file  Social History Narrative  . Not on file   Social Determinants of Health   Financial Resource Strain: Not on file  Food Insecurity: Not on file  Transportation Needs: Not on file  Physical Activity: Not on file  Stress: Not on file  Social Connections: Not on file   Outpatient Encounter Medications as of 03/14/2020  Medication Sig  . albuterol (PROVENTIL) (2.5 MG/3ML) 0.083% nebulizer solution USE 1 VIAL VIA NEBULIZER FOUR TIMES DAILY  . albuterol (VENTOLIN HFA) 108 (90 Base) MCG/ACT inhaler Inhale into the lungs.  Marland Kitchen apixaban (ELIQUIS) 5 MG TABS tablet Take 5 mg by mouth 2 (two) times daily.  Marland Kitchen aspirin EC 81 MG tablet Take 81 mg by mouth daily.  . carvedilol (COREG) 6.25 MG tablet Take 6.25 mg by mouth 2 (two) times daily.  .  cholecalciferol (VITAMIN D3) 25 MCG (1000 UNIT) tablet Take 1,000 Units by mouth daily.  . Continuous Blood Gluc Sensor (FREESTYLE LIBRE 14 DAY SENSOR) MISC 1 each by Does not apply route every 14 (fourteen) days.  . Continuous Blood Gluc Sensor (FREESTYLE LIBRE SENSOR SYSTEM) MISC Use one sensor every 10 days.  Marland Kitchen donepezil (ARICEPT) 5 MG tablet Take 5 mg by mouth at bedtime.  Marland Kitchen esomeprazole (NEXIUM) 40 MG capsule Take 40 mg by mouth daily at 12 noon.  . furosemide (LASIX) 20 MG tablet Take 80 mg by mouth daily.   Marland Kitchen glucagon (GLUCAGON EMERGENCY) 1 MG injection Inject 1 mg into the vein once as needed.  Marland Kitchen glucose blood (CONTOUR NEXT TEST) test strip Use to test blood glucose 4 times a day. E11.65  . ibuprofen (ADVIL,MOTRIN) 800 MG tablet Take 800 mg by mouth every 8 (eight) hours as needed.  . insulin aspart (NOVOLOG FLEXPEN) 100 UNIT/ML FlexPen As directed only if premeal glucose is above 150  . insulin degludec (TRESIBA FLEXTOUCH) 100 UNIT/ML FlexTouch Pen Inject 50 units sq daily at bedtime  . Insulin Pen Needle (GLOBAL EASE INJECT PEN NEEDLES) 31G X 8 MM MISC 1 each by Other route See admin instructions. USE AS DIRECTED FOUR TIMES DAILY  . ipratropium (ATROVENT) 0.02 %  nebulizer solution USE 1 VIAL VIA NEBULIZER FOUR TIMES DAILY AS DIRECTED  . ipratropium-albuterol (DUONEB) 0.5-2.5 (3) MG/3ML SOLN Take 3 mLs by nebulization as directed.  . isosorbide dinitrate (ISORDIL) 30 MG tablet Take 30 mg by mouth daily.  Marland Kitchen levothyroxine (SYNTHROID) 50 MCG tablet Take 1 tablet (50 mcg total) by mouth daily before breakfast.  . LORazepam (ATIVAN) 0.5 MG tablet Take 0.5 mg by mouth at bedtime.  Marland Kitchen losartan (COZAAR) 100 MG tablet Take 25 mg by mouth 2 (two) times daily.  . Microlet Lancets MISC Use 4 times a day to test blood glucose.  . nitroGLYCERIN (NITROSTAT) 0.4 MG SL tablet Place 0.4 mg under the tongue every 5 (five) minutes as needed for chest pain.  . potassium chloride (K-DUR) 10 MEQ tablet Take 10  mEq by mouth daily.  . prasugrel (EFFIENT) 10 MG TABS tablet Take 10 mg by mouth daily.  . predniSONE (DELTASONE) 10 MG tablet Take 10 mg by mouth daily with breakfast.  . rosuvastatin (CRESTOR) 40 MG tablet Take 40 mg by mouth at bedtime.  . Fluticasone-Salmeterol (ADVAIR) 250-50 MCG/DOSE AEPB Inhale 1 puff into the lungs 2 (two) times daily as needed. (Patient not taking: Reported on 03/14/2020)  . meclizine (ANTIVERT) 25 MG tablet Take 25 mg by mouth 2 (two) times daily as needed for dizziness. (Patient not taking: Reported on 03/14/2020)  . ranitidine (ZANTAC) 150 MG capsule Take 150 mg by mouth 2 (two) times daily. (Patient not taking: Reported on 03/14/2020)  . sotalol (BETAPACE) 80 MG tablet Take 80 mg by mouth 2 (two) times daily. (Patient not taking: Reported on 03/14/2020)  . TERBINAFINE HCL PO Take by mouth. (Patient not taking: No sig reported)  . tiotropium (SPIRIVA) 18 MCG inhalation capsule Place 18 mcg into inhaler and inhale daily. (Patient not taking: No sig reported)   No facility-administered encounter medications on file as of 03/14/2020.   ALLERGIES: Allergies  Allergen Reactions  . Lac Bovis Nausea Only  . Plavix [Clopidogrel]   . Sucralfate    VACCINATION STATUS: Immunization History  Administered Date(s) Administered  . Influenza-Unspecified 12/28/2011  . Pneumococcal Conjugate-13 11/08/2013    Diabetes He presents for his follow-up diabetic visit. He has type 2 diabetes mellitus. Onset time: He was diagnosed at approximate age of 54 years. His disease course has been fluctuating. There are no hypoglycemic associated symptoms. Pertinent negatives for hypoglycemia include no confusion, headaches, pallor or seizures. There are no diabetic associated symptoms. Pertinent negatives for diabetes include no chest pain, no fatigue, no polydipsia, no polyphagia, no polyuria and no weakness. There are no hypoglycemic complications. Symptoms are stable. Diabetic  complications include a CVA, heart disease and peripheral neuropathy. Risk factors for coronary artery disease include diabetes mellitus, hypertension, obesity, sedentary lifestyle, male sex and dyslipidemia. Current diabetic treatment includes intensive insulin program. He is compliant with treatment most of the time. His weight is decreasing steadily. He is following a diabetic (his wife limits his carb intake) diet. When asked about meal planning, he reported none. He has not had a previous visit with a dietitian. He rarely participates in exercise. His home blood glucose trend is increasing steadily. (He presents today for hospital follow up after being hospitalized for 3 days due to cardiac and respiratory issues.  He reports he has been intentionally reducing his insulin to avoid low glucose readings in the afternoon due to his other medical issues.  Analysis of his CGM shows 14-day average TIR-17%, TAR 80%, TBR-3%.) An ACE  inhibitor/angiotensin II receptor blocker is being taken. He does not see a podiatrist.Eye exam is current.  Hyperlipidemia This is a chronic problem. The current episode started more than 1 year ago. The problem is controlled. Recent lipid tests were reviewed and are variable. Exacerbating diseases include chronic renal disease, diabetes and hypothyroidism. Factors aggravating his hyperlipidemia include beta blockers and fatty foods. Pertinent negatives include no chest pain, myalgias or shortness of breath. Current antihyperlipidemic treatment includes statins. The current treatment provides mild improvement of lipids. Compliance problems include adherence to diet and adherence to exercise.  Risk factors for coronary artery disease include diabetes mellitus, dyslipidemia, hypertension, a sedentary lifestyle, male sex and obesity.  Hypertension This is a chronic problem. The current episode started more than 1 year ago. The problem is unchanged. The problem is controlled. Pertinent  negatives include no chest pain, headaches, neck pain, palpitations or shortness of breath. Agents associated with hypertension include thyroid hormones and steroids. Risk factors for coronary artery disease include diabetes mellitus, dyslipidemia, male gender and sedentary lifestyle. Past treatments include angiotensin blockers, diuretics and beta blockers. The current treatment provides moderate improvement. Compliance problems include diet and exercise.  Hypertensive end-organ damage includes CAD/MI, CVA and heart failure. Identifiable causes of hypertension include chronic renal disease and a thyroid problem.    Review of systems  Constitutional: + Minimally fluctuating body weight,  current Body mass index is 27.31 kg/m. , + fatigue, no subjective hyperthermia, no subjective hypothermia Eyes: no blurry vision, no xerophthalmia ENT: no sore throat, no nodules palpated in throat, no dysphagia/odynophagia, no hoarseness Cardiovascular: no chest pain, no palpitations, + leg swelling (being seen by cardiology for this) Respiratory: + chronic cough, +chronic shortness of breath, on home oxygen (R/t COPD) Gastrointestinal: no nausea/vomiting/diarrhea Musculoskeletal: no muscle/joint aches Skin: no rashes, no hyperemia Neurological: no tremors, no numbness, no tingling, no dizziness Psychiatric: no depression, no anxiety    Objective:    BP (!) 161/77 (BP Location: Right Arm, Patient Position: Sitting)   Pulse 60   Ht 6' (1.829 m)   Wt 201 lb 6.4 oz (91.4 kg)   BMI 27.31 kg/m   Wt Readings from Last 3 Encounters:  03/14/20 201 lb 6.4 oz (91.4 kg)  01/11/20 213 lb (96.6 kg)  08/25/19 210 lb 9.6 oz (95.5 kg)    BP Readings from Last 3 Encounters:  03/14/20 (!) 161/77  01/11/20 129/66  08/25/19 (!) 166/70    Physical Exam- Limited  Constitutional:  Body mass index is 27.31 kg/m. , not in acute distress, normal state of mind Eyes:  EOMI, no exophthalmos Neck: Supple Thyroid: No  gross goiter Cardiovascular: RRR, no murmers, rubs, or gallops, + Bilateral LE edema  Respiratory: + chronic cough, +chronic shortness of breath, on home oxygen, dyspnea at rest, + crackles, rhonchi, and inspiratory/expiratory wheezing noted Musculoskeletal: no gross deformities, strength intact in all four extremities, no gross restriction of joint movements, significantly deconditioned- is essentially bed bound due to respiratory decline, can walk short distances. Skin:  no rashes, no hyperemia Neurological: no tremor with outstretched hands    Diabetic Labs (most recent): Lab Results  Component Value Date   HGBA1C 8.7 (A) 01/11/2020   HGBA1C 8.3 (A) 08/25/2019   HGBA1C 9.2 (A) 04/26/2019   CMP Latest Ref Rng & Units 12/29/2019 08/24/2019 12/24/2018  BUN 4 - 21 22(A) 17 21  Creatinine 0.6 - 1.3 1.1 0.9 1.2  Calcium 8.7 - 10.7 9.1 - -     Lipid Panel  Component Value Date/Time   CHOL 152 12/29/2019 0000   TRIG 167 (A) 12/29/2019 0000   HDL 39 12/29/2019 0000   LDLCALC 80 12/29/2019 0000    Assessment & Plan:   1) DM type 2 causing vascular disease ([place])  - Patient has currently uncontrolled symptomatic type 2 DM since 79 years of age.  -He presents today for hospital follow up after being hospitalized for 3 days due to cardiac and respiratory issues.  He reports he has been intentionally reducing his insulin to avoid low glucose readings in the afternoon due to his other medical issues.  Analysis of his CGM shows 14-day average TIR-17%, TAR 80%, TBR-3%.   - his diabetes is complicated by CVA and patient remains at a high risk for more acute and chronic complications of diabetes which include CAD, CVA, CKD, retinopathy, and neuropathy. These are all discussed in detail with the patient.  - Nutritional counseling repeated at each appointment due to patients tendency to fall back in to old habits.  - The patient admits there is a room for improvement in their diet and drink  choices. -  Suggestion is made for the patient to avoid simple carbohydrates from their diet including Cakes, Sweet Desserts / Pastries, Ice Cream, Soda (diet and regular), Sweet Tea, Candies, Chips, Cookies, Sweet Pastries,  Store Bought Juices, Alcohol in Excess of  1-2 drinks a day, Artificial Sweeteners, Coffee Creamer, and "Sugar-free" Products. This will help patient to have stable blood glucose profile and potentially avoid unintended weight gain.   - I encouraged the patient to switch to  unprocessed or minimally processed complex starch and increased protein intake (animal or plant source), fruits, and vegetables.   - Patient is advised to stick to a routine mealtimes to eat 3 meals  a day and avoid unnecessary snacks ( to snack only to correct hypoglycemia).  - I have approached patient with the following individualized plan to manage diabetes and patient agrees:   -He is still taking prednisone 10 mg p.o. every day for chronic COPD.  I advised him to avoid sudden stopping of prednisone to prevent adrenal crisis.   -This patient will continue to require intensive treatment with basal/bolus insulin in order for him to achieve and maintain control of diabetes to target.    -The main goal of therapy in this patient would be avoiding hypoglycemia due to advanced age, as well as his unexplained seizure attacks associated with hypoglycemia.   -He has done better since discontinuation of Glipizide.  He is advised to contineu Tresiba 50 units SQ daily at bedtime.  He can lower his Novolog to 2-5 units TID with meals if glucose is above 150 and he is eating.  Specific instructions on how to titrate insulin dose based on glucose readings given to patient in writing.  -He is encouraged to continue using his CGM to monitor glucose at least 4 times daily, before meals and before bed, and call the clinic if he has readings less than 70 or greater than 300 for 3 tests in a row.  - Reportedly, he does  not tolerate metformin, tried DPPIV inhibitors before, will avoid those medications for now.   - Patient specific target  A1c;  LDL, HDL, Triglycerides, and  Waist Circumference were discussed in detail.  2) BP/HTN:  His blood pressure is slightly above target.  He is advised to continue Lasix 80 mg po daily, Isosorbide dinitrate 30 mg po daily, Losartan 25 mg po twice daily,  and Coreg 6.25 mg po twice daily.  Will defer any med changes to nephrology (sees them tomorrow).  3) Lipids/HPL: His most recent lipid panel from 12/29/19 shows controlled LDL at 80 and elevated triglycerides of 167.  He is not on any lipid lowering medication at this time.  He is advised to avoid fried foods and butter.  4)  Weight/Diet:  His Body mass index is 27.31 kg/m.-- not a candidate for major weight loss.  CDE Consult has been  initiated, exercise, and detailed carbohydrates information provided.  5) Hypothyroidism: -His previsit thyroid function tests are consistent with appropriate hormone replacement.  He is advised to continue Levothyroxine 50 mcg po daily before breakfast.   - We discussed about the correct intake of his thyroid hormone, on empty stomach at fasting, with water, separated by at least 30 minutes from breakfast and other medications,  and separated by more than 4 hours from calcium, iron, multivitamins, acid reflux medications (PPIs). -Patient is made aware of the fact that thyroid hormone replacement is needed for life, dose to be adjusted by periodic monitoring of thyroid function tests.  6) Chronic Care/Health Maintenance: -Patient is on ARB and encouraged to continue to follow up with Ophthalmology, Podiatrist at least yearly or according to recommendations, and advised to   stay away from smoking. I have recommended yearly flu vaccine and pneumonia vaccination at least every 5 years;  and  sleep for at least 7 hours a day.   - I advised patient to maintain close follow up with Quentin Cornwall, MD for primary care needs.   - Time spent on this patient care encounter:  35 min, of which > 50% was spent in  counseling and the rest reviewing his blood glucose logs , discussing his hypoglycemia and hyperglycemia episodes, reviewing his current and  previous labs / studies  ( including abstraction from other facilities) and medications  doses and developing a  long term treatment plan and documenting his care.   Please refer to Patient Instructions for Blood Glucose Monitoring and Insulin/Medications Dosing Guide"  in media tab for additional information. Please  also refer to " Patient Self Inventory" in the Media  tab for reviewed elements of pertinent patient history.  Barry Turner Barry Turner participated in the discussions, expressed understanding, and voiced agreement with the above plans.  All questions were answered to his satisfaction. he is encouraged to contact clinic should he have any questions or concerns prior to his return visit.    Follow up plan: - No follow-ups on file.  Rayetta Pigg, [facility] [address] [place], [place] [address] Phone: [phone] Fax: [phone]   03/14/2020, 10:14 AM

## 2020-04-13 ENCOUNTER — Ambulatory Visit: Payer: Medicare Other | Admitting: Nurse Practitioner

## 2020-04-13 NOTE — Patient Instructions (Incomplete)

## 2020-04-25 ENCOUNTER — Other Ambulatory Visit: Payer: Self-pay | Admitting: "Endocrinology

## 2020-04-25 DIAGNOSIS — E1159 Type 2 diabetes mellitus with other circulatory complications: Secondary | ICD-10-CM

## 2020-05-12 ENCOUNTER — Telehealth: Payer: Self-pay | Admitting: Nurse Practitioner

## 2020-05-12 NOTE — Telephone Encounter (Signed)
Tried contacting pt to get him back on the schedule, no answer, waiting for call back. Okay to sch with labs we have.

## 2020-05-18 ENCOUNTER — Ambulatory Visit: Payer: Medicare Other | Admitting: Nurse Practitioner

## 2020-05-18 ENCOUNTER — Other Ambulatory Visit: Payer: Self-pay

## 2020-05-18 ENCOUNTER — Encounter: Payer: Self-pay | Admitting: Nurse Practitioner

## 2020-05-18 VITALS — BP 136/65 | HR 60 | Ht 72.0 in | Wt 205.0 lb

## 2020-05-18 DIAGNOSIS — E039 Hypothyroidism, unspecified: Secondary | ICD-10-CM

## 2020-05-18 DIAGNOSIS — I10 Essential (primary) hypertension: Secondary | ICD-10-CM

## 2020-05-18 DIAGNOSIS — E1159 Type 2 diabetes mellitus with other circulatory complications: Secondary | ICD-10-CM

## 2020-05-18 DIAGNOSIS — E559 Vitamin D deficiency, unspecified: Secondary | ICD-10-CM

## 2020-05-18 DIAGNOSIS — E782 Mixed hyperlipidemia: Secondary | ICD-10-CM

## 2020-05-18 LAB — POCT GLYCOSYLATED HEMOGLOBIN (HGB A1C): HbA1c, POC (controlled diabetic range): 11.1 % — AB (ref 0.0–7.0)

## 2020-05-18 MED ORDER — TRESIBA FLEXTOUCH 100 UNIT/ML ~~LOC~~ SOPN: 60.0000 [IU] | PEN_INJECTOR | Freq: Every day | SUBCUTANEOUS | 0 refills | Status: AC

## 2020-05-18 MED ORDER — NOVOLOG FLEXPEN 100 UNIT/ML ~~LOC~~ SOPN: 5.0000 [IU] | PEN_INJECTOR | Freq: Three times a day (TID) | SUBCUTANEOUS | 0 refills | Status: AC

## 2020-05-18 NOTE — Patient Instructions (Signed)

## 2020-05-18 NOTE — Progress Notes (Signed)
05/18/2020   Endocrinology follow-up note    Subjective:    Patient ID: Barry Turner, male    DOB: February 04, 1941. Patient is being seen in  follow up for management of type 2  Diabetes, hypothyroidism, hyperlipidemia, hypertension. PMD:  Quentin Cornwall, MD  Past Medical History:  Diagnosis Date  . CHF (congestive heart failure) ([place])   . COPD (chronic obstructive pulmonary disease) ([place])   . Coronary artery disease   . Diabetes mellitus, type II ([place])   . Dysthymic disorder   . Hyperlipidemia   . Hypothyroidism    Past Surgical History:  Procedure Laterality Date  . CHOLECYSTECTOMY    . CORONARY ARTERY BYPASS GRAFT    . MEDIASTINOSCOPY     Social History   Socioeconomic History  . Marital status: Married    Spouse name: Not on file  . Number of children: Not on file  . Years of education: Not on file  . Highest education level: Not on file  Occupational History  . Not on file  Tobacco Use  . Smoking status: Never Smoker  . Smokeless tobacco: Never Used  Vaping Use  . Vaping Use: Never used  Substance and Sexual Activity  . Alcohol use: No  . Drug use: No  . Sexual activity: Not on file  Other Topics Concern  . Not on file  Social History Narrative  . Not on file   Social Determinants of Health   Financial Resource Strain: Not on file  Food Insecurity: Not on file  Transportation Needs: Not on file  Physical Activity: Not on file  Stress: Not on file  Social Connections: Not on file   Outpatient Encounter Medications as of 05/18/2020  Medication Sig  . albuterol (PROVENTIL) (2.5 MG/3ML) 0.083% nebulizer solution USE 1 VIAL VIA NEBULIZER FOUR TIMES DAILY  . albuterol (VENTOLIN HFA) 108 (90 Base) MCG/ACT inhaler Inhale into the lungs.  Marland Kitchen apixaban (ELIQUIS) 5 MG TABS tablet Take 5 mg by mouth 2 (two) times daily.  . Continuous Blood Gluc Sensor (FREESTYLE LIBRE 14 DAY SENSOR) MISC 1 each by Does not apply route every 14 (fourteen) days.  . Continuous  Blood Gluc Sensor (FREESTYLE LIBRE SENSOR SYSTEM) MISC Use one sensor every 10 days.  Marland Kitchen donepezil (ARICEPT) 5 MG tablet Take 5 mg by mouth at bedtime.  Marland Kitchen esomeprazole (NEXIUM) 40 MG capsule Take 40 mg by mouth daily at 12 noon.  . Fluticasone-Salmeterol (ADVAIR) 250-50 MCG/DOSE AEPB Inhale 1 puff into the lungs 2 (two) times daily as needed.  . furosemide (LASIX) 20 MG tablet Take 20 mg by mouth daily.  Marland Kitchen glucagon (GLUCAGON EMERGENCY) 1 MG injection Inject 1 mg into the vein once as needed.  Marland Kitchen glucose blood (CONTOUR NEXT TEST) test strip Use to test blood glucose 4 times a day. E11.65  . ibuprofen (ADVIL,MOTRIN) 800 MG tablet Take 800 mg by mouth every 8 (eight) hours as needed.  . Insulin Pen Needle (GLOBAL EASE INJECT PEN NEEDLES) 31G X 8 MM MISC 1 each by Other route See admin instructions. USE AS DIRECTED FOUR TIMES DAILY  . ipratropium (ATROVENT) 0.02 % nebulizer solution USE 1 VIAL VIA NEBULIZER FOUR TIMES DAILY AS DIRECTED  . ipratropium-albuterol (DUONEB) 0.5-2.5 (3) MG/3ML SOLN Take 3 mLs by nebulization as directed.  . isosorbide mononitrate (IMDUR) 30 MG 24 hr tablet Take 30 mg by mouth 2 (two) times daily.  Marland Kitchen levothyroxine (SYNTHROID) 50 MCG tablet Take 1 tablet (50 mcg total) by mouth daily  before breakfast.  . LORazepam (ATIVAN) 0.5 MG tablet Take 0.5 mg by mouth at bedtime.  Marland Kitchen losartan (COZAAR) 25 MG tablet Take 25 mg by mouth 2 (two) times daily.  . Microlet Lancets MISC Use 4 times a day to test blood glucose.  . nitroGLYCERIN (NITROSTAT) 0.4 MG SL tablet Place 0.4 mg under the tongue every 5 (five) minutes as needed for chest pain.  . potassium chloride (K-DUR) 10 MEQ tablet Take 10 mEq by mouth daily.  . prasugrel (EFFIENT) 10 MG TABS tablet Take 10 mg by mouth daily.  . predniSONE (DELTASONE) 10 MG tablet Take 10 mg by mouth daily with breakfast.  . rosuvastatin (CRESTOR) 40 MG tablet Take 40 mg by mouth at bedtime.  . [DISCONTINUED] insulin aspart (NOVOLOG FLEXPEN) 100  UNIT/ML FlexPen As directed only if premeal glucose is above 150  . [DISCONTINUED] insulin degludec (TRESIBA FLEXTOUCH) 100 UNIT/ML FlexTouch Pen INJECT 50 UNITS SUBCUTANEOUSLY ONCE A DAY AT BEDTIME  . [DISCONTINUED] isosorbide dinitrate (ISORDIL) 30 MG tablet Take 30 mg by mouth daily.  . [DISCONTINUED] losartan (COZAAR) 100 MG tablet Take 25 mg by mouth 2 (two) times daily.  . insulin aspart (NOVOLOG FLEXPEN) 100 UNIT/ML FlexPen Inject 5-8 Units into the skin 3 (three) times daily with meals. As directed only if premeal glucose is above 150  . insulin degludec (TRESIBA FLEXTOUCH) 100 UNIT/ML FlexTouch Pen Inject 60 Units into the skin at bedtime. INJECT 50 UNITS SUBCUTANEOUSLY ONCE A DAY AT BEDTIME  . [DISCONTINUED] aspirin EC 81 MG tablet Take 81 mg by mouth daily.  . [DISCONTINUED] carvedilol (COREG) 6.25 MG tablet Take 6.25 mg by mouth 2 (two) times daily.  . [DISCONTINUED] cholecalciferol (VITAMIN D3) 25 MCG (1000 UNIT) tablet Take 1,000 Units by mouth daily.  . [DISCONTINUED] meclizine (ANTIVERT) 25 MG tablet Take 25 mg by mouth 2 (two) times daily as needed for dizziness. (Patient not taking: Reported on 03/14/2020)  . [DISCONTINUED] ranitidine (ZANTAC) 150 MG capsule Take 150 mg by mouth 2 (two) times daily. (Patient not taking: Reported on 03/14/2020)  . [DISCONTINUED] sotalol (BETAPACE) 80 MG tablet Take 80 mg by mouth 2 (two) times daily. (Patient not taking: Reported on 03/14/2020)  . [DISCONTINUED] TERBINAFINE HCL PO Take by mouth. (Patient not taking: No sig reported)  . [DISCONTINUED] tiotropium (SPIRIVA) 18 MCG inhalation capsule Place 18 mcg into inhaler and inhale daily. (Patient not taking: No sig reported)   No facility-administered encounter medications on file as of 05/18/2020.   ALLERGIES: Allergies  Allergen Reactions  . Lac Bovis Nausea Only  . Plavix [Clopidogrel]   . Sucralfate    VACCINATION STATUS: Immunization History  Administered Date(s) Administered  .  Influenza-Unspecified 12/28/2011  . Pneumococcal Conjugate-13 11/08/2013    Diabetes He presents for his follow-up diabetic visit. He has type 2 diabetes mellitus. Onset time: He was diagnosed at approximate age of 82 years. His disease course has been worsening. There are no hypoglycemic associated symptoms. Pertinent negatives for hypoglycemia include no confusion, headaches, pallor or seizures. Associated symptoms include blurred vision, chest pain, fatigue, polydipsia and polyphagia. Pertinent negatives for diabetes include no polyuria and no weakness. There are no hypoglycemic complications. Symptoms are worsening. Diabetic complications include a CVA, heart disease and peripheral neuropathy. Risk factors for coronary artery disease include diabetes mellitus, hypertension, obesity, sedentary lifestyle, male sex and dyslipidemia. Current diabetic treatment includes intensive insulin program. He is compliant with treatment some of the time. His weight is decreasing steadily. He is following a diabetic (  eats small meals due to fatigue from respiratory issues ) diet. When asked about meal planning, he reported none. He has not had a previous visit with a dietitian. He rarely participates in exercise. His home blood glucose trend is increasing steadily. His breakfast blood glucose range is generally >200 mg/dl. His lunch blood glucose range is generally >200 mg/dl. His dinner blood glucose range is generally >200 mg/dl. His bedtime blood glucose range is generally >200 mg/dl. His overall blood glucose range is >200 mg/dl. (He presents today, accompanied by his wife, with his CGM, no logs, showing significantly high glycemic profile.  He has been in and out of the hospital several times since his last visit with me for cardiac and respiratory issues.  His POCT A1c today is 11.1%, worsening from last visit of 8.7%.  He has not been injecting his insulin as recommended, will frequently adjust his dose as he wishes  due to fear of hypoglycemia.) An ACE inhibitor/angiotensin II receptor blocker is being taken. He does not see a podiatrist.Eye exam is current.  Hyperlipidemia This is a chronic problem. The current episode started more than 1 year ago. The problem is controlled. Recent lipid tests were reviewed and are variable. Exacerbating diseases include chronic renal disease, diabetes and hypothyroidism. Factors aggravating his hyperlipidemia include beta blockers and fatty foods. Associated symptoms include chest pain. Pertinent negatives include no myalgias or shortness of breath. Current antihyperlipidemic treatment includes statins. The current treatment provides mild improvement of lipids. Compliance problems include adherence to diet and adherence to exercise.  Risk factors for coronary artery disease include diabetes mellitus, dyslipidemia, hypertension, a sedentary lifestyle, male sex and obesity.  Hypertension This is a chronic problem. The current episode started more than 1 year ago. The problem is unchanged. The problem is controlled. Associated symptoms include blurred vision and chest pain. Pertinent negatives include no headaches, neck pain, palpitations or shortness of breath. Agents associated with hypertension include thyroid hormones and steroids. Risk factors for coronary artery disease include diabetes mellitus, dyslipidemia, male gender and sedentary lifestyle. Past treatments include angiotensin blockers, diuretics and beta blockers. The current treatment provides moderate improvement. Compliance problems include diet and exercise.  Hypertensive end-organ damage includes CAD/MI, CVA and heart failure. Identifiable causes of hypertension include chronic renal disease and a thyroid problem.    Review of systems  Constitutional: + Minimally fluctuating body weight,  current Body mass index is 27.8 kg/m. , + fatigue, no subjective hyperthermia, no subjective hypothermia Eyes: + blurry vision, no  xerophthalmia ENT: no sore throat, no nodules palpated in throat, no dysphagia/odynophagia, no hoarseness Cardiovascular: no chest pain, no palpitations, no swelling Respiratory: + chronic cough, +chronic shortness of breath, on home oxygen (R/t COPD) Gastrointestinal: no nausea/vomiting/diarrhea Musculoskeletal: no muscle/joint aches Skin: no rashes, no hyperemia Neurological: no tremors, no numbness, no tingling, no dizziness Psychiatric: no depression, no anxiety    Objective:    BP 136/65 (BP Location: Left Arm, Patient Position: Sitting)   Pulse 60   Ht 6' (1.829 m)   Wt 205 lb (93 kg)   BMI 27.80 kg/m   Wt Readings from Last 3 Encounters:  05/18/20 205 lb (93 kg)  03/14/20 201 lb 6.4 oz (91.4 kg)  01/11/20 213 lb (96.6 kg)    BP Readings from Last 3 Encounters:  05/18/20 136/65  03/14/20 (!) 161/77  01/11/20 129/66    Physical Exam- Limited  Constitutional:  Body mass index is 27.8 kg/m. , not in acute distress, normal state of  mind Eyes:  EOMI, no exophthalmos Neck: Supple Cardiovascular: RRR, no murmers, rubs, or gallops,  Respiratory: + chronic cough, +chronic shortness of breath, on home oxygen, severe dyspnea at rest, rhonchi, and inspiratory/expiratory wheezing noted Musculoskeletal: no gross deformities, strength intact in all four extremities, no gross restriction of joint movements, significantly deconditioned- is essentially bed bound due to respiratory decline, can walk short distances. Skin:  no rashes, no hyperemia Neurological: no tremor with outstretched hands    Diabetic Labs (most recent): Lab Results  Component Value Date   HGBA1C 11.1 (A) 05/18/2020   HGBA1C 8.7 (A) 01/11/2020   HGBA1C 8.3 (A) 08/25/2019   CMP Latest Ref Rng & Units 12/29/2019 08/24/2019 12/24/2018  BUN 4 - 21 22(A) 17 21  Creatinine 0.6 - 1.3 1.1 0.9 1.2  Calcium 8.7 - 10.7 9.1 - -     Lipid Panel     Component Value Date/Time   CHOL 152 12/29/2019 0000   TRIG 167  (A) 12/29/2019 0000   HDL 39 12/29/2019 0000   LDLCALC 80 12/29/2019 0000    Assessment & Plan:   1) DM type 2 causing vascular disease ([place])  - Patient has currently uncontrolled symptomatic type 2 DM since 80 years of age.  He presents today, accompanied by his wife, with his CGM, no logs, showing significantly high glycemic profile.  He has been in and out of the hospital several times since his last visit with me for cardiac and respiratory issues.  His POCT A1c today is 11.1%, worsening from last visit of 8.7%.  He has not been injecting his insulin as recommended, will frequently adjust his dose as he wishes due to fear of hypoglycemia.  His is really struggling to breathe at this appointment, I advised him to go to the ED for evaluation and treatment, which he declined.   - his diabetes is complicated by CVA and patient remains at a high risk for more acute and chronic complications of diabetes which include CAD, CVA, CKD, retinopathy, and neuropathy. These are all discussed in detail with the patient.  - Nutritional counseling repeated at each appointment due to patients tendency to fall back in to old habits.  - The patient admits there is a room for improvement in their diet and drink choices. -  Suggestion is made for the patient to avoid simple carbohydrates from their diet including Cakes, Sweet Desserts / Pastries, Ice Cream, Soda (diet and regular), Sweet Tea, Candies, Chips, Cookies, Sweet Pastries,  Store Bought Juices, Alcohol in Excess of  1-2 drinks a day, Artificial Sweeteners, Coffee Creamer, and "Sugar-free" Products. This will help patient to have stable blood glucose profile and potentially avoid unintended weight gain.   - I encouraged the patient to switch to  unprocessed or minimally processed complex starch and increased protein intake (animal or plant source), fruits, and vegetables.   - Patient is advised to stick to a routine mealtimes to eat 3 meals  a day and  avoid unnecessary snacks ( to snack only to correct hypoglycemia).  - I have approached patient with the following individualized plan to manage diabetes and patient agrees:   -He is still taking prednisone 10 mg p.o. every day for chronic COPD.  I advised him to avoid sudden stopping of prednisone to prevent adrenal crisis.   -This patient will continue to require intensive treatment with basal/bolus insulin in order for him to achieve and maintain control of diabetes to target.    -The main goal  of therapy in this patient would be avoiding hypoglycemia due to advanced age, as well as his unexplained seizure attacks associated with hypoglycemia.   -Given his recent loss of control, he is advised to increase his Tresiba to 60 units SQ nightly and increase his Novolog to 5-8 units TID with meals if glucose is above 150 and he is eating.  Specific instructions on how to titrate insulin dose based on glucose readings given to patient in writing.    -He is encouraged to continue using his CGM to monitor glucose at least 4 times daily, before meals and before bed, and call the clinic if he has readings less than 70 or greater than 300 for 3 tests in a row.  -I am having him return in 1 month to try and manage his diabetes optimally.  He is resistant to medication changes due to fear of hypoglycemia, which can be extremely challenging when attempting to regain control.   - Reportedly, he does not tolerate metformin, tried DPPIV inhibitors before, will avoid those medications for now.   - Patient specific target  A1c;  LDL, HDL, Triglycerides, and  Waist Circumference were discussed in detail.  2) BP/HTN:  His blood pressure is controlled to target.  He is advised to continue Lasix 80 mg po daily, Isosorbide dinitrate 30 mg po daily, Losartan 25 mg po twice daily, and Coreg 6.25 mg po twice daily.  Will defer any med changes to nephrology or cardiology.  3) Lipids/HPL: His most recent lipid panel  from 12/29/19 shows controlled LDL at 80 and elevated triglycerides of 167.  He is not on any lipid lowering medication at this time.  He is advised to avoid fried foods and butter.  4)  Weight/Diet:  His Body mass index is 27.8 kg/m.-- not a candidate for major weight loss.  CDE Consult has been  initiated, exercise, and detailed carbohydrates information provided.  5) Hypothyroidism: -There are no recent TFTs to review.  He is advised to continue Levothyroxine 50 mcg po daily before breakfast for now.  Will recheck TFTs on subsequent visits.    - We discussed about the correct intake of his thyroid hormone, on empty stomach at fasting, with water, separated by at least 30 minutes from breakfast and other medications,  and separated by more than 4 hours from calcium, iron, multivitamins, acid reflux medications (PPIs). -Patient is made aware of the fact that thyroid hormone replacement is needed for life, dose to be adjusted by periodic monitoring of thyroid function tests.  6) Chronic Care/Health Maintenance: -Patient is on ARB and encouraged to continue to follow up with Ophthalmology, Podiatrist at least yearly or according to recommendations, and advised to   stay away from smoking. I have recommended yearly flu vaccine and pneumonia vaccination at least every 5 years;  and  sleep for at least 7 hours a day.   - I advised patient to maintain close follow up with Quentin Cornwall, MD for primary care needs.   - Time spent on this patient care encounter:  40 min, of which > 50% was spent in  counseling and the rest reviewing his blood glucose logs , discussing his hypoglycemia and hyperglycemia episodes, reviewing his current and  previous labs / studies  ( including abstraction from other facilities) and medications  doses and developing a  long term treatment plan and documenting his care.   Please refer to Patient Instructions for Blood Glucose Monitoring and Insulin/Medications Dosing Guide"  in media tab for additional information. Please  also refer to " Patient Self Inventory" in the Media  tab for reviewed elements of pertinent patient history.  Izell [place] Sr Megan Salon participated in the discussions, expressed understanding, and voiced agreement with the above plans.  All questions were answered to his satisfaction. he is encouraged to contact clinic should he have any questions or concerns prior to his return visit.    Follow up plan: - Return in about 1 month (around 06/15/2020) for Diabetes follow up, Bring glucometer and logs.  Rayetta Pigg, [facility] [address] [place], [place] [address] Phone: [phone] Fax: [phone]   05/18/2020, 4:36 PM

## 2020-06-07 ENCOUNTER — Other Ambulatory Visit: Payer: Self-pay | Admitting: "Endocrinology

## 2020-06-15 ENCOUNTER — Ambulatory Visit: Payer: Medicare Other | Admitting: Nurse Practitioner

## 2020-09-12 ENCOUNTER — Ambulatory Visit: Payer: Medicare Other | Admitting: Nurse Practitioner

## 2020-09-24 ENCOUNTER — Emergency Department (HOSPITAL_BASED_OUTPATIENT_CLINIC_OR_DEPARTMENT_OTHER): Payer: Medicare Other

## 2020-09-24 ENCOUNTER — Emergency Department (HOSPITAL_BASED_OUTPATIENT_CLINIC_OR_DEPARTMENT_OTHER)
Admission: EM | Admit: 2020-09-24 | Discharge: 2020-09-24 | Disposition: A | Discharge status: Home / Self Care | Payer: Medicare Other | Attending: Emergency Medicine | Admitting: Emergency Medicine

## 2020-09-24 DIAGNOSIS — E1169 Type 2 diabetes mellitus with other specified complication: Secondary | ICD-10-CM | POA: Insufficient documentation

## 2020-09-24 DIAGNOSIS — Z79899 Other long term (current) drug therapy: Secondary | ICD-10-CM | POA: Diagnosis not present

## 2020-09-24 DIAGNOSIS — Z7951 Long term (current) use of inhaled steroids: Secondary | ICD-10-CM | POA: Insufficient documentation

## 2020-09-24 DIAGNOSIS — I2510 Atherosclerotic heart disease of native coronary artery without angina pectoris: Secondary | ICD-10-CM | POA: Diagnosis not present

## 2020-09-24 DIAGNOSIS — I110 Hypertensive heart disease with heart failure: Secondary | ICD-10-CM | POA: Insufficient documentation

## 2020-09-24 DIAGNOSIS — Z951 Presence of aortocoronary bypass graft: Secondary | ICD-10-CM | POA: Insufficient documentation

## 2020-09-24 DIAGNOSIS — E876 Hypokalemia: Secondary | ICD-10-CM

## 2020-09-24 DIAGNOSIS — Z8673 Personal history of transient ischemic attack (TIA), and cerebral infarction without residual deficits: Secondary | ICD-10-CM | POA: Insufficient documentation

## 2020-09-24 DIAGNOSIS — J449 Chronic obstructive pulmonary disease, unspecified: Secondary | ICD-10-CM | POA: Insufficient documentation

## 2020-09-24 DIAGNOSIS — I509 Heart failure, unspecified: Secondary | ICD-10-CM | POA: Insufficient documentation

## 2020-09-24 DIAGNOSIS — R627 Adult failure to thrive: Secondary | ICD-10-CM

## 2020-09-24 DIAGNOSIS — W19XXXA Unspecified fall, initial encounter: Secondary | ICD-10-CM | POA: Diagnosis not present

## 2020-09-24 DIAGNOSIS — E785 Hyperlipidemia, unspecified: Secondary | ICD-10-CM | POA: Insufficient documentation

## 2020-09-24 DIAGNOSIS — Z7901 Long term (current) use of anticoagulants: Secondary | ICD-10-CM | POA: Diagnosis not present

## 2020-09-24 DIAGNOSIS — Z85118 Personal history of other malignant neoplasm of bronchus and lung: Secondary | ICD-10-CM | POA: Insufficient documentation

## 2020-09-24 DIAGNOSIS — S0990XA Unspecified injury of head, initial encounter: Secondary | ICD-10-CM | POA: Diagnosis not present

## 2020-09-24 DIAGNOSIS — Z794 Long term (current) use of insulin: Secondary | ICD-10-CM | POA: Insufficient documentation

## 2020-09-24 DIAGNOSIS — E039 Hypothyroidism, unspecified: Secondary | ICD-10-CM | POA: Insufficient documentation

## 2020-09-24 LAB — CBC WITH DIFFERENTIAL/PLATELET
Abs Immature Granulocytes: 0.03 10*3/uL (ref 0.00–0.07)
Basophils Absolute: 0.1 10*3/uL (ref 0.0–0.1)
Basophils Relative: 1 %
Eosinophils Absolute: 0.3 10*3/uL (ref 0.0–0.5)
Eosinophils Relative: 2 %
HCT: 39.7 % (ref 39.0–52.0)
Hemoglobin: 13.1 g/dL (ref 13.0–17.0)
Immature Granulocytes: 0 %
Lymphocytes Relative: 14 %
Lymphs Abs: 1.7 10*3/uL (ref 0.7–4.0)
MCH: 29.4 pg (ref 26.0–34.0)
MCHC: 33 g/dL (ref 30.0–36.0)
MCV: 89 fL (ref 80.0–100.0)
Monocytes Absolute: 1.2 10*3/uL — ABNORMAL HIGH (ref 0.1–1.0)
Monocytes Relative: 11 %
Neutro Abs: 8.3 10*3/uL — ABNORMAL HIGH (ref 1.7–7.7)
Neutrophils Relative %: 72 %
Platelets: 314 10*3/uL (ref 150–400)
RBC: 4.46 MIL/uL (ref 4.22–5.81)
RDW: 14.3 % (ref 11.5–15.5)
WBC: 11.5 10*3/uL — ABNORMAL HIGH (ref 4.0–10.5)
nRBC: 0 % (ref 0.0–0.2)

## 2020-09-24 LAB — URINALYSIS, ROUTINE W REFLEX MICROSCOPIC
Glucose, UA: NEGATIVE mg/dL
Hgb urine dipstick: NEGATIVE
Ketones, ur: 40 mg/dL — AB
Nitrite: NEGATIVE
Protein, ur: 100 mg/dL — AB
Specific Gravity, Urine: 1.023 (ref 1.005–1.030)
pH: 6 (ref 5.0–8.0)

## 2020-09-24 LAB — COMPREHENSIVE METABOLIC PANEL
ALT: 8 U/L (ref 0–44)
AST: 22 U/L (ref 15–41)
Albumin: 3 g/dL — ABNORMAL LOW (ref 3.5–5.0)
Alkaline Phosphatase: 85 U/L (ref 38–126)
Anion gap: 13 (ref 5–15)
BUN: 18 mg/dL (ref 8–23)
CO2: 28 mmol/L (ref 22–32)
Calcium: 10.2 mg/dL (ref 8.9–10.3)
Chloride: 97 mmol/L — ABNORMAL LOW (ref 98–111)
Creatinine, Ser: 0.81 mg/dL (ref 0.61–1.24)
GFR, Estimated: >60 mL/min (ref >60)
Glucose, Bld: 212 mg/dL — ABNORMAL HIGH (ref 70–99)
Potassium: 2.8 mmol/L — ABNORMAL LOW (ref 3.5–5.1)
Sodium: 138 mmol/L (ref 135–145)
Total Bilirubin: 1.9 mg/dL — ABNORMAL HIGH (ref 0.3–1.2)
Total Protein: 6.2 g/dL — ABNORMAL LOW (ref 6.5–8.1)

## 2020-09-24 LAB — CK: Total CK: 75 U/L (ref 49–397)

## 2020-09-24 LAB — MAGNESIUM: Magnesium: 1.5 mg/dL — ABNORMAL LOW (ref 1.7–2.4)

## 2020-09-24 LAB — LACTIC ACID, PLASMA: Lactic Acid, Venous: 1.7 mmol/L (ref 0.5–1.9)

## 2020-09-24 LAB — PROTIME-INR
INR: 1.2 (ref 0.8–1.2)
Prothrombin Time: 15.2 seconds (ref 11.4–15.2)

## 2020-09-24 LAB — TROPONIN I (HIGH SENSITIVITY): Troponin I (High Sensitivity): 17 ng/L (ref <18)

## 2020-09-24 MED ORDER — POTASSIUM CHLORIDE 10 MEQ/100ML IV SOLN
10.0000 meq | INTRAVENOUS | Status: DC
  Administered 2020-09-24: 10 meq via INTRAVENOUS
  Filled 2020-09-24 (×2): qty 100

## 2020-09-24 MED ORDER — MAGNESIUM SULFATE 2 GM/50ML IV SOLN
2.0000 g | Freq: Once | INTRAVENOUS | Status: AC
  Administered 2020-09-24: 2 g via INTRAVENOUS
  Filled 2020-09-24: qty 50

## 2020-09-24 MED ORDER — SODIUM CHLORIDE 0.9 % IV BOLUS
500.0000 mL | Freq: Once | INTRAVENOUS | Status: AC
  Administered 2020-09-24: 500 mL via INTRAVENOUS

## 2020-09-24 NOTE — ED Provider Notes (Signed)
[facility] EMERGENCY DEPT Provider Note   CSN: [id number] Arrival date & time: 09/24/20  2019     History Chief Complaint  Patient presents with   Fall   Failure To Thrive    Barry Turner is a 80 y.o. male.  Presents to ER with concern for failure, fall.  Patient lives at home with wife.  Found by family members on the ground today, believe patient may have fallen last night.  Patient has had significant functional decline over the last few months.  Significantly worse over the last couple weeks.  Grandson states patient has been more or less bedbound for the past 2 weeks.  Grandson also reports that patient has previously been hesitant on nursing home placement.  Normally goes to medical providers in [place] but due to concern about care being given in [place] they brought patient here.  [name] provides most history.   Per review of chart has history of heart failure, COPD, CAD, diabetes, CVA.  Grandson states was supposed to be taking blood thinner for A. fib history but patient has not been taking any of his medicines for the past couple weeks.  HPI     Past Medical History:  Diagnosis Date   CHF (congestive heart failure) (HCC)    COPD (chronic obstructive pulmonary disease) (HCC)    Coronary artery disease    Diabetes mellitus, type II ([place])    Dysthymic disorder    Hyperlipidemia    Hypothyroidism     Patient Active Problem List   Diagnosis Date Noted   Mixed hyperlipidemia 10/29/2016   DM type 2 causing vascular disease ([place]) 05/09/2016   Hypothyroidism 05/09/2016   Essential hypertension, benign 05/09/2016   Lung cancer ([place]) 05/09/2016   CVA (cerebral vascular accident) ([place]) 05/09/2016   Endobronchial mass 03/29/2014   Vitamin D insufficiency 07/05/2013   Syncope 07/05/2013   Hypertension 07/05/2013   GERD (gastroesophageal reflux disease) 07/05/2013   GAD (generalized anxiety disorder) 07/05/2013   COPD (chronic  obstructive pulmonary disease) ([place]) 07/05/2013   CAD (coronary artery disease) 07/05/2013    Past Surgical History:  Procedure Laterality Date   CHOLECYSTECTOMY     CORONARY ARTERY BYPASS GRAFT     MEDIASTINOSCOPY         Family History  Problem Relation Age of Onset   Heart disease Mother     Social History   Tobacco Use   Smoking status: Never   Smokeless tobacco: Never  Vaping Use   Vaping Use: Never used  Substance Use Topics   Alcohol use: No   Drug use: No    Home Medications Prior to Admission medications   Medication Sig Start Date End Date Taking? Authorizing Provider  albuterol (PROVENTIL) (2.5 MG/3ML) 0.083% nebulizer solution USE 1 VIAL VIA NEBULIZER FOUR TIMES DAILY 12/15/19   [provider]  albuterol (VENTOLIN HFA) 108 (90 Base) MCG/ACT inhaler Inhale into the lungs.    [provider]  apixaban (ELIQUIS) 5 MG TABS tablet Take 5 mg by mouth 2 (two) times daily.    [provider]  Continuous Blood Gluc Sensor (FREESTYLE LIBRE 14 DAY SENSOR) MISC 1 each by Does not apply route every 14 (fourteen) days. 12/28/18   Cassandria Anger, MD  Continuous Blood Gluc Sensor (FREESTYLE LIBRE SENSOR SYSTEM) MISC Use one sensor every 10 days. 12/31/18   Cassandria Anger, MD  donepezil (ARICEPT) 5 MG tablet Take 5 mg by mouth at bedtime. 02/08/20   [provider]  esomeprazole (NEXIUM) 40 MG capsule Take 40 mg by mouth daily at 12 noon.    [provider]  Fluticasone-Salmeterol (ADVAIR) 250-50 MCG/DOSE AEPB Inhale 1 puff into the lungs 2 (two) times daily as needed.    [provider]  furosemide (LASIX) 20 MG tablet Take 20 mg by mouth daily.    [provider]  GLOBAL EASE INJECT PEN NEEDLES 31G X 8 MM MISC USE AS DIRECTED 06/07/20   Nida, Marella Chimes, MD  glucagon (GLUCAGON EMERGENCY) 1 MG injection Inject 1 mg into the vein once as needed. 10/29/16   Cassandria Anger, MD  glucose blood  (CONTOUR NEXT TEST) test strip Use to test blood glucose 4 times a day. E11.65 05/07/17   Cassandria Anger, MD  ibuprofen (ADVIL,MOTRIN) 800 MG tablet Take 800 mg by mouth every 8 (eight) hours as needed.    [provider]  insulin aspart (NOVOLOG FLEXPEN) 100 UNIT/ML FlexPen Inject 5-8 Units into the skin 3 (three) times daily with meals. As directed only if premeal glucose is above 150 05/18/20   Reardon, Loree Fee J, NP  insulin degludec (TRESIBA FLEXTOUCH) 100 UNIT/ML FlexTouch Pen Inject 60 Units into the skin at bedtime. INJECT 50 UNITS SUBCUTANEOUSLY ONCE A DAY AT BEDTIME 05/18/20   Rayetta Pigg J, NP  ipratropium (ATROVENT) 0.02 % nebulizer solution USE 1 VIAL VIA NEBULIZER FOUR TIMES DAILY AS DIRECTED 12/15/19   [provider]  ipratropium-albuterol (DUONEB) 0.5-2.5 (3) MG/3ML SOLN Take 3 mLs by nebulization as directed.    [provider]  isosorbide mononitrate (IMDUR) 30 MG 24 hr tablet Take 30 mg by mouth 2 (two) times daily. 04/02/20   [provider]  levothyroxine (SYNTHROID) 50 MCG tablet Take 1 tablet (50 mcg total) by mouth daily before breakfast. 03/06/20   Brita Romp, NP  LORazepam (ATIVAN) 0.5 MG tablet Take 0.5 mg by mouth at bedtime.    [provider]  losartan (COZAAR) 25 MG tablet Take 25 mg by mouth 2 (two) times daily. 05/10/20   [provider]  Microlet Lancets MISC Use 4 times a day to test blood glucose. 06/03/18   Cassandria Anger, MD  nitroGLYCERIN (NITROSTAT) 0.4 MG SL tablet Place 0.4 mg under the tongue every 5 (five) minutes as needed for chest pain.    [provider]  potassium chloride (K-DUR) 10 MEQ tablet Take 10 mEq by mouth daily.    [provider]  prasugrel (EFFIENT) 10 MG TABS tablet Take 10 mg by mouth daily. 03/02/20   [provider]  predniSONE (DELTASONE) 10 MG tablet Take 10 mg by mouth daily with breakfast.    [provider]  rosuvastatin  (CRESTOR) 40 MG tablet Take 40 mg by mouth at bedtime. 03/02/20   [provider]    Allergies    Lac bovis, Plavix [clopidogrel], and Sucralfate  Review of Systems   Review of Systems  Unable to perform ROS: Mental status change   Physical Exam Updated Vital Signs BP 122/62   Pulse 84   Temp 98.5 F (36.9 C) (Oral)   Resp 20   Ht 6\' 1"  (1.854 m)   Wt 65.9 kg   SpO2 100%   BMI 19.17 kg/m   Physical Exam Vitals and nursing note reviewed.  Constitutional:      Appearance: He is well-developed.     Comments: Chronically ill-appearing, somewhat disheveled  HENT:     Head: Normocephalic and atraumatic.  Eyes:     Conjunctiva/sclera: Conjunctivae normal.  Cardiovascular:     Rate and Rhythm: Normal rate and regular rhythm.     Heart sounds: No murmur heard. Pulmonary:     Effort: Pulmonary effort is normal. No respiratory distress.     Breath sounds: Normal breath sounds.  Abdominal:     Palpations: Abdomen is soft.     Tenderness: There is no abdominal tenderness.  Musculoskeletal:     Cervical back: Neck supple.     Comments: No obvious deformity or injury to all 4 extremities, there was some tenderness noted in the left shoulder  Skin:    Comments: Skin is dry  Neurological:     Comments: Alert but somewhat lethargic, answers questions slowly, follows commands and moves all 4 extremities equally    ED Results / Procedures / Treatments   Labs (all labs ordered are listed, but only abnormal results are displayed) Labs Reviewed  CBC WITH DIFFERENTIAL/PLATELET - Abnormal; Notable for the following components:      Result Value   WBC 11.5 (*)    Neutro Abs 8.3 (*)    Monocytes Absolute 1.2 (*)    All other components within normal limits  COMPREHENSIVE METABOLIC PANEL - Abnormal; Notable for the following components:   Potassium 2.8 (*)    Chloride 97 (*)    Glucose, Bld 212 (*)    Total Protein 6.2 (*)    Albumin 3.0 (*)    Total Bilirubin 1.9 (*)     All other components within normal limits  URINALYSIS, ROUTINE W REFLEX MICROSCOPIC - Abnormal; Notable for the following components:   Bilirubin Urine SMALL (*)    Ketones, ur 40 (*)    Protein, ur 100 (*)    Leukocytes,Ua TRACE (*)    All other components within normal limits  MAGNESIUM - Abnormal; Notable for the following components:   Magnesium 1.5 (*)    All other components within normal limits  PROTIME-INR  CK  LACTIC ACID, PLASMA  TROPONIN I (HIGH SENSITIVITY)    EKG EKG Interpretation  Date/Time:  Sunday September 24 2020 20:49:30 EDT Ventricular Rate:  92 PR Interval:    QRS Duration: 148 QT Interval:  426 QTC Calculation: 528 R Axis:   -72 Text Interpretation: Atrial fibrillation RBBB and LAFB Inferior infarct, old Baseline wander in lead(s) V3 V4 V5 Confirmed by Madalyn Rob [phone]) on 09/24/2020 8:58:15 PM  Radiology DG Chest 1 View  Result Date: 09/24/2020 CLINICAL DATA:  Unwitnessed fall.  Shoulder pain. EXAM: CHEST  1 VIEW COMPARISON:  None. FINDINGS: Postoperative changes in the mediastinum. Heart size is normal. Calcified granulomas in the lungs. No focal airspace disease or consolidation. Probable bronchiectasis. No pleural effusions. No pneumothorax. Calcification of the aorta. IMPRESSION: No evidence of active pulmonary disease. Probable chronic bronchitic changes. Electronically Signed   By: Lucienne Capers M.D.   On: 09/24/2020 21:54   DG Pelvis 1-2 Views  Result Date: 09/24/2020 CLINICAL DATA:  Pain after a fall. EXAM: PELVIS - 1-2 VIEW COMPARISON:  None. FINDINGS: Diffuse bone demineralization. Degenerative changes in the lower lumbar spine and hips. No acute displaced fractures are seen in the pelvis or hips. SI joints and symphysis pubis are not displaced. No focal bone lesions. Vascular calcifications. IMPRESSION: Degenerative changes in the lower lumbar spine and hips. No acute displaced fractures identified in the pelvis. Electronically Signed   By:  Lucienne Capers M.D.   On: 09/24/2020 21:56   DG Shoulder Right  Result Date: 09/24/2020 CLINICAL DATA:  Unwitnessed fall.  Shoulder pain. EXAM: RIGHT SHOULDER - 2+ VIEW COMPARISON:  None. FINDINGS: Degenerative changes in the glenohumeral joint. Previous resection or resorption of the distal right clavicle. No evidence of acute fracture or dislocation. No focal bone lesion or bone destruction is appreciated. Visualized portions of the right ribs appear intact. Soft tissues are unremarkable. IMPRESSION: Degenerative changes in the right shoulder. No acute displaced fractures identified. Electronically Signed   By: Lucienne Capers M.D.   On: 09/24/2020 21:55   CT Head Wo Contrast  Result Date: 09/24/2020 CLINICAL DATA:  Head trauma, mod-severe Unwitnessed fall. EXAM: CT HEAD WITHOUT CONTRAST TECHNIQUE: Contiguous axial images were obtained from the base of the skull through the vertex without intravenous contrast. COMPARISON:  None. FINDINGS: Brain: No intracranial hemorrhage, mass effect, or midline shift. Age related atrophy. Moderate periventricular and deep white matter hypodensity consistent with chronic small vessel ischemia. No hydrocephalus. The basilar cisterns are patent. No evidence of territorial infarct or acute ischemia. No extra-axial or intracranial fluid collection. Vascular: Atherosclerosis of skullbase vasculature without hyperdense vessel or abnormal calcification. Skull: No fracture or focal lesion. Sinuses/Orbits: Scattered opacification of bilateral mastoid air cells. No temporal bone fracture. Paranasal sinuses are clear. Bilateral cataract resection. Other: None. IMPRESSION: 1. No acute intracranial abnormality. No skull fracture. 2. Age related atrophy and chronic small vessel ischemia. Electronically Signed   By: Keith Rake M.D.   On: 09/24/2020 21:41   DG Shoulder Left Portable  Result Date: 09/24/2020 CLINICAL DATA:  Left shoulder pain. EXAM: LEFT SHOULDER- 3 VIEW  COMPARISON:  None. FINDINGS: There is no evidence of fracture or dislocation. Mild degenerative spurring is seen involving the inferior margin of the glenoid fossa. No other bone lesions identified. Soft tissues are unremarkable. IMPRESSION: No acute findings. Mild glenohumeral degenerative spurring. Electronically Signed   By: Marlaine Hind M.D.   On: 09/24/2020 22:19    Procedures Procedures   Medications Ordered in ED Medications  potassium chloride 10 mEq in 100 mL IVPB (10 mEq Intravenous New Bag/Given 09/24/20 2153)  magnesium sulfate IVPB 2 g 50 mL (2 g Intravenous New Bag/Given 09/24/20 2143)  sodium chloride 0.9 % bolus 500 mL (500 mLs Intravenous New Bag/Given 09/24/20 2145)    ED Course  I have reviewed the triage vital signs and the nursing notes.  Pertinent labs & imaging results that were available during my care of the patient were reviewed by me and considered in my medical decision making (see chart for details).    MDM Rules/Calculators/A&P                          80 year old male presents to ER with concern for fall, failure to thrive.  On exam patient appears chronically ill-appearing but was not in any acute distress.  He had normal vital signs.  Clinically he appeared somewhat dehydrated.  Obtain broad work-up.  No traumatic findings identified on plain films.  CT head negative for any acute traumatic or other acute pathology.  Basic labs are concerning for some degree of hypokalemia and hypomagnesemia but there is no evidence for renal failure, no evidence for rhabdomyolysis.  No infection based on UA.  Provided electrolyte replacement and some fluid replacement.  Does have history of heart failure so limited fluid bolus.  Based on his work-up today, do not see indication for admission.  Discussed case with case management, Mariann Laster, [place], assistance from ER would be limited and from social standpoint patient would be best served following up with  primary doctor.  Lengthy discussion with grandson at bedside.  He is a former [employer] trader.  He states that they have strong family support regarding day-to-day caregiving at present.  Based on patient's current clinical appearance believe he would benefit from evaluation for SNF placement.  Given that work-up today and the reported family support, believe that this can be pursued in an outpatient basis.  Stressed need for close follow-up with PCP.  Stressed need for close monitoring of electrolytes on an outpatient basis.  Stressed need for low threshold to return to ER should his clinical condition worsen at home.  Final Clinical Impression(s) / ED Diagnoses Final diagnoses:  Hypomagnesemia  Hypokalemia  Failure to thrive in adult    Rx / DC Orders ED Discharge Orders     None        Lucrezia Starch, MD 09/24/20 2316

## 2020-09-24 NOTE — ED Triage Notes (Addendum)
Pt to ED from home with c/o unwitnessed fall last night at 0300 where family found him lying on his right side. Pt family states he is on blood thinners (xarelto) but has refused all PO medications. Pt has not been eating or drinking and has gotten progressively diminished in his mentation.

## 2020-09-24 NOTE — Discharge Instructions (Addendum)
Strongly recommend close follow-up with your primary care doctor on Tuesday.  Please discuss referral to social work and palliative care.  He should have his electrolytes and kidney function rechecked within the next couple days.  If he has any worsening of his condition, develops fever, further falls, difficulty in breathing or other new concerning symptom, recommend going back to ER for reevaluation.

## 2020-09-24 NOTE — Care Management (Signed)
ED RNCM discussed details of [facility] consult with Dr Roslynn Amble.  Patient is apparently from [place], and family has brought him to [facility] after recurrent falls, FTT for evaluation and placement because on not liking the  care in [place].. Patient has family support at home.  Patient is still being evaluated. But if cleared for discharge family can pursue SNF placement with patient's PCP.  Laurena Slimmer RN, BSN  ED Care Manager [phone]

## 2020-10-12 ENCOUNTER — Ambulatory Visit: Payer: Medicare Other | Admitting: Nurse Practitioner

## 2020-10-23 DEATH — deceased

## 2022-10-12 IMAGING — DX DG SHOULDER 2+V*R*
3 series · 3 of 3 positions shown · non-contrast
Comparison: None.

CLINICAL DATA: Unwitnessed fall.  Shoulder pain.

EXAM:
RIGHT SHOULDER - 2+ VIEW

[shoulder y view]
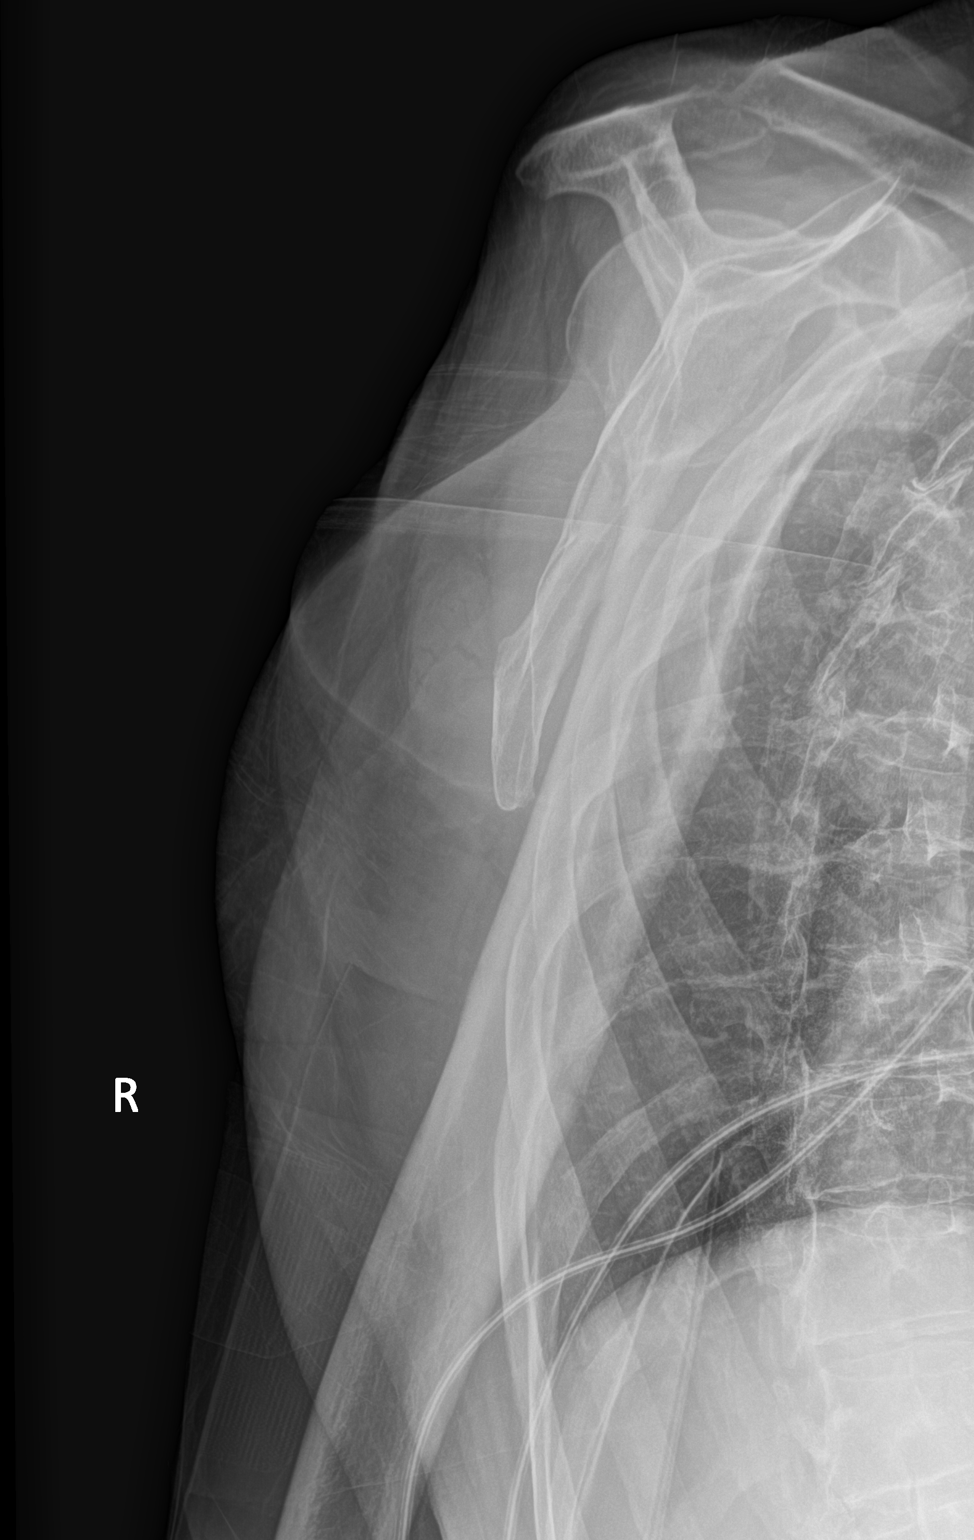

[shoulder ap (1 of 2)]
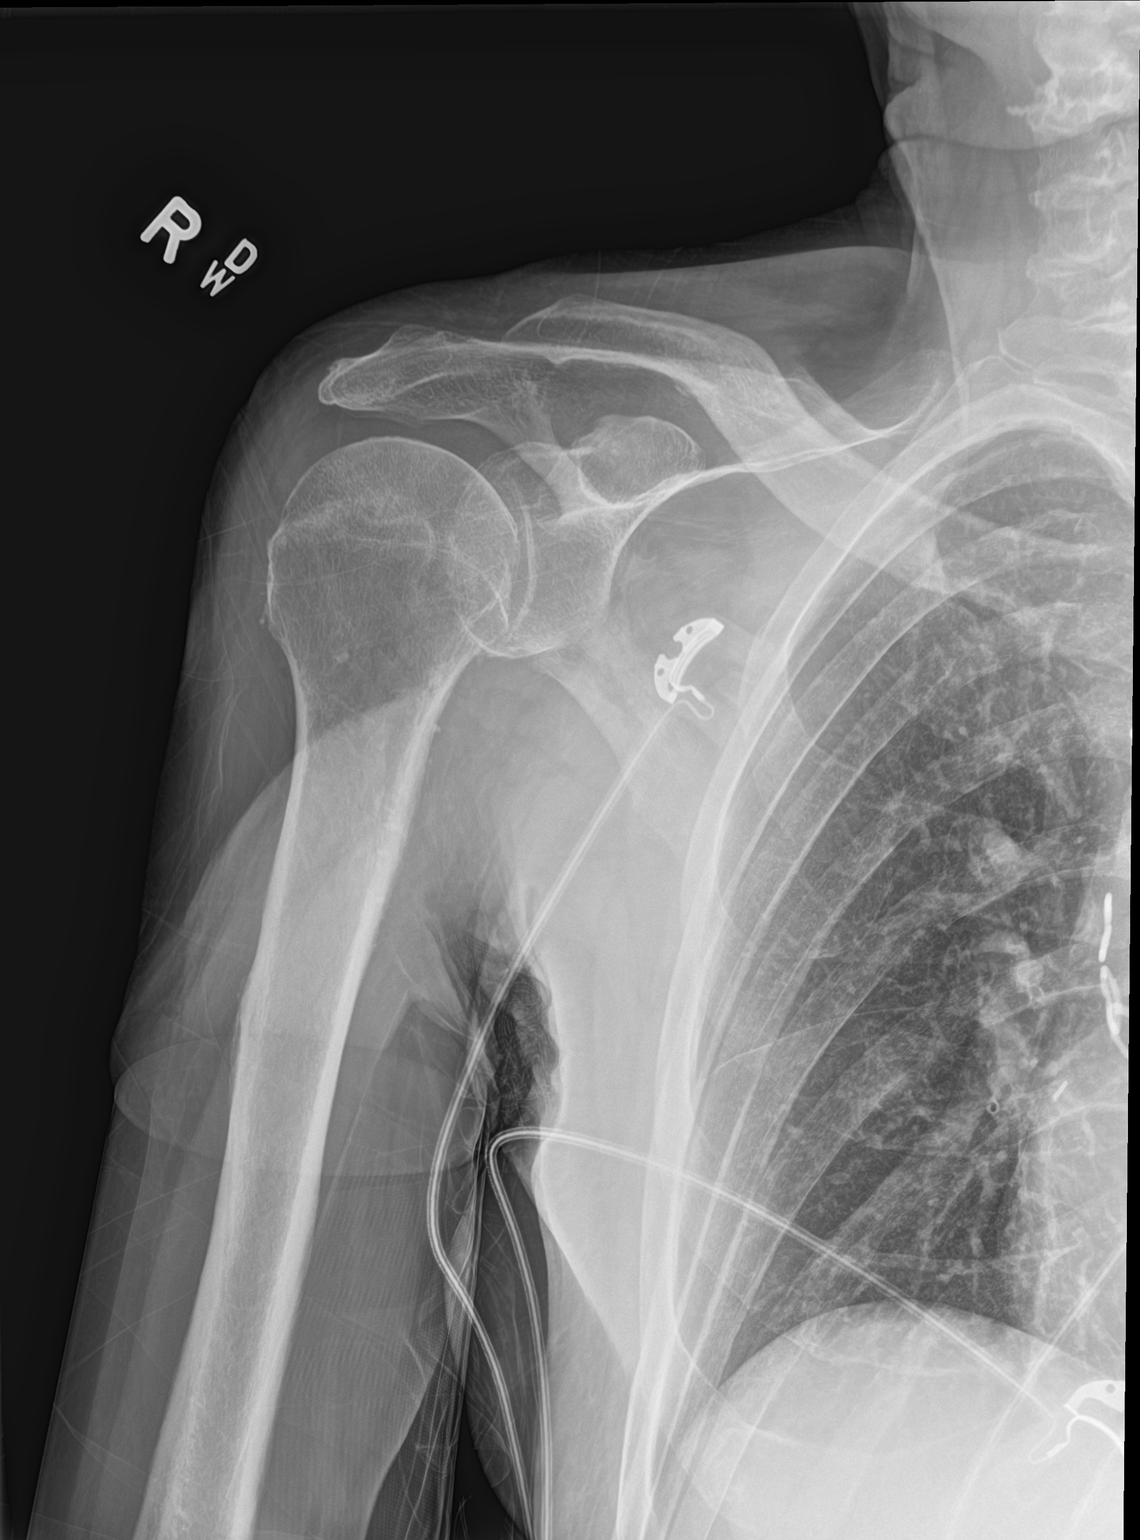

[shoulder ap (2 of 2)]
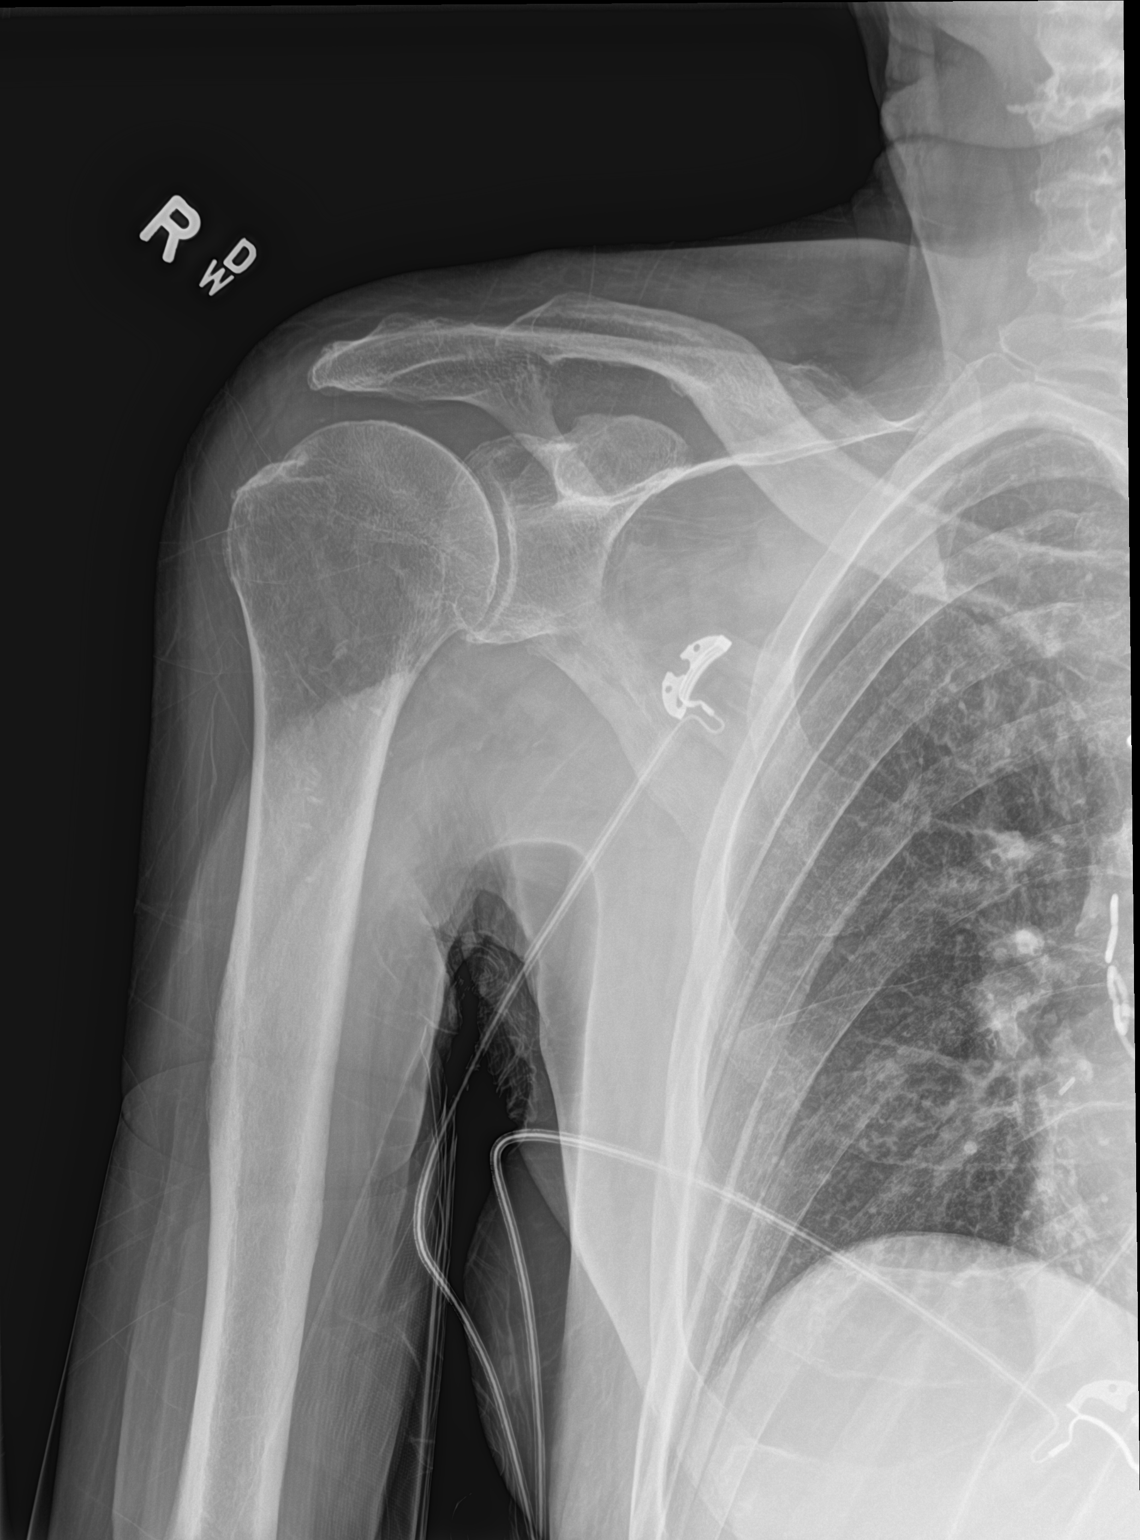

[3 of 3 positions shown; findings below may reference images not displayed]

FINDINGS: Degenerative changes in the glenohumeral joint. Previous resection
or resorption of the distal right clavicle. No evidence of acute
fracture or dislocation. No focal bone lesion or bone destruction is
appreciated. Visualized portions of the right ribs appear intact.
Soft tissues are unremarkable.
IMPRESSION: Degenerative changes in the right shoulder. No acute displaced
fractures identified.
